# Patient Record
Sex: Male | Born: 1970
Health system: Southern US, Community
[De-identification: ages and names within clinical notes are randomized; demographics above are authoritative.]

## PROBLEM LIST (undated history)

## (undated) DIAGNOSIS — I214 Non-ST elevation (NSTEMI) myocardial infarction: Secondary | ICD-10-CM

## (undated) DIAGNOSIS — I639 Cerebral infarction, unspecified: Secondary | ICD-10-CM

## (undated) DIAGNOSIS — R972 Elevated prostate specific antigen [PSA]: Secondary | ICD-10-CM

## (undated) DIAGNOSIS — E7800 Pure hypercholesterolemia, unspecified: Secondary | ICD-10-CM

## (undated) DIAGNOSIS — I10 Essential (primary) hypertension: Secondary | ICD-10-CM

## (undated) DIAGNOSIS — C61 Malignant neoplasm of prostate: Secondary | ICD-10-CM

## (undated) DIAGNOSIS — Q2112 Patent foramen ovale: Secondary | ICD-10-CM

## (undated) HISTORY — DX: Patent foramen ovale: Q21.12

## (undated) HISTORY — DX: Non-ST elevation (NSTEMI) myocardial infarction: I21.4

## (undated) HISTORY — DX: Pure hypercholesterolemia, unspecified: E78.00

## (undated) HISTORY — DX: Cerebral infarction, unspecified: I63.9

## (undated) HISTORY — PX: PROSTATE BIOPSY: SHX241

---

## 2010-01-11 ENCOUNTER — Encounter
Admission: RE | Admit: 2010-01-11 | Discharge: 2010-01-11 | Payer: Self-pay | Source: Home / Self Care | Attending: Family Medicine | Admitting: Family Medicine

## 2010-11-23 ENCOUNTER — Emergency Department (HOSPITAL_COMMUNITY)
Admission: EM | Admit: 2010-11-23 | Discharge: 2010-11-23 | Disposition: A | Discharge status: Home / Self Care | Payer: 59 | Attending: Emergency Medicine | Admitting: Emergency Medicine

## 2010-11-23 DIAGNOSIS — R040 Epistaxis: Secondary | ICD-10-CM | POA: Insufficient documentation

## 2010-11-23 DIAGNOSIS — I10 Essential (primary) hypertension: Secondary | ICD-10-CM | POA: Insufficient documentation

## 2010-11-23 DIAGNOSIS — Z79899 Other long term (current) drug therapy: Secondary | ICD-10-CM | POA: Insufficient documentation

## 2010-11-23 HISTORY — DX: Essential (primary) hypertension: I10

## 2010-11-23 MED ORDER — OXYMETAZOLINE HCL 0.05 % NA SOLN
1.0000 | Freq: Two times a day (BID) | NASAL | Status: DC
  Filled 2010-11-23: qty 15

## 2010-11-23 MED ORDER — OXYMETAZOLINE HCL 0.05 % NA SOLN
2.0000 | Freq: Once | NASAL | Status: AC
  Administered 2010-11-23: 2 via NASAL
  Filled 2010-11-23: qty 15

## 2010-11-23 NOTE — ED Provider Notes (Signed)
History     CSN: [id number] Arrival date & time: 11/23/2010  2:06 PM   First MD Initiated Contact with Patient 11/23/10 1529      No chief complaint on file.   (Consider location/radiation/quality/duration/timing/severity/associated sxs/prior treatment) HPI Aside from a cold, was in his [facility] prior to yesterday when he had a few episodes of epistaxis, which all ceased spontaneously.  Today, about four hours pta, the patient developed epistaxis again.  Over the interval four hours he has had several similar episodes. No ha, lh, n/v, near-syncope, dyspnea or other complaints.  Past Medical History  Diagnosis Date  . Hypertension     History reviewed. No pertinent past surgical history.  History reviewed. No pertinent family history.  History  Substance Use Topics  . Smoking status: Never Smoker   . Smokeless tobacco: Not on file  . Alcohol Use: No      Review of Systems  All other systems reviewed and are negative.    Allergies  Review of patient's allergies indicates no known allergies.  Home Medications   Current Outpatient Rx  Name Route Sig Dispense Refill  . NEBIVOLOL HCL 20 MG PO TABS Oral Take 1 tablet by mouth daily.      Marland Kitchen ROSUVASTATIN CALCIUM 10 MG PO TABS Oral Take 10 mg by mouth daily.        BP 179/99  Pulse 79  Temp(Src) 98.1 F (36.7 C) (Oral)  Resp 20  SpO2 98%  Physical Exam  Constitutional: He is oriented to person, place, and time. He appears well-developed and well-nourished.  HENT:  Head: Normocephalic and atraumatic.       R nare w erythematous medial surface, no clear bleeding.  Eyes: Conjunctivae are normal. Pupils are equal, round, and reactive to light.  Neck: Neck supple.  Cardiovascular: Normal rate and regular rhythm.   Pulmonary/Chest: No respiratory distress.  Abdominal: Soft. There is no tenderness.  Musculoskeletal: He exhibits no edema.  Neurological: He is alert and oriented to person, place, and time.  Skin: Skin is  warm and dry.  Psychiatric: He has a normal mood and affect.    ED Course  EPISTAXIS MANAGEMENT Date/Time: 11/23/2010 3:00 PM Performed by: Gerhard Munch Authorized by: Gerhard Munch Consent: Verbal consent obtained. The procedure was performed in an emergent situation. Risks and benefits: risks, benefits and alternatives were discussed Consent given by: patient Patient understanding: patient states understanding of the procedure being performed Patient consent: the patient's understanding of the procedure matches consent given Procedure consent: procedure consent matches procedure scheduled Patient identity confirmed: verbally with patient Time out: Immediately prior to procedure a "time out" was called to verify the correct patient, procedure, equipment, support staff and site/side marked as required. Local anesthetic: pseudophedrine. Patient sedated: no Treatment site: right Kiesselbach's area Repair method: cophenylcaine Post-procedure assessment: bleeding stopped Treatment complexity: simple Patient tolerance: Patient tolerated the procedure well with no immediate complications.   (including critical care time)  Labs Reviewed - No data to display No results found.   1. Epistaxis       MDM  This previously well male presented w concerns of ongoing epistaxis.  Following cleaning and packing (temp) w phenylephrine soaked gauze, the bleeding ceased.  On re-eval, the patient had no active bleeding and was d/c w explicit return precautions.        Gerhard Munch, MD 11/24/10 [phone]

## 2010-11-23 NOTE — ED Notes (Signed)
sts nose bleed at 10 am while watching tv, now controlled. sts a clot came when he blew his nose and bleeding worse when occurred.

## 2010-11-23 NOTE — ED Notes (Signed)
Pt. Beginning to have bleeding from Rt. Nares again. ED MD aware and pt. Brought to room 10 for further examination. ENT cart to room as well

## 2015-03-18 ENCOUNTER — Encounter (HOSPITAL_COMMUNITY): Payer: Self-pay | Admitting: Emergency Medicine

## 2015-03-18 ENCOUNTER — Inpatient Hospital Stay (HOSPITAL_COMMUNITY): Payer: 59

## 2015-03-18 ENCOUNTER — Observation Stay (HOSPITAL_COMMUNITY)
Admission: EM | Admit: 2015-03-18 | Discharge: 2015-03-20 | Disposition: A | Discharge status: Home / Self Care | Payer: 59 | Attending: Internal Medicine | Admitting: Internal Medicine

## 2015-03-18 DIAGNOSIS — R748 Abnormal levels of other serum enzymes: Secondary | ICD-10-CM | POA: Diagnosis not present

## 2015-03-18 DIAGNOSIS — E782 Mixed hyperlipidemia: Secondary | ICD-10-CM | POA: Diagnosis not present

## 2015-03-18 DIAGNOSIS — N183 Chronic kidney disease, stage 3 unspecified: Secondary | ICD-10-CM | POA: Diagnosis present

## 2015-03-18 DIAGNOSIS — I10 Essential (primary) hypertension: Secondary | ICD-10-CM | POA: Diagnosis present

## 2015-03-18 DIAGNOSIS — R778 Other specified abnormalities of plasma proteins: Secondary | ICD-10-CM | POA: Diagnosis present

## 2015-03-18 DIAGNOSIS — Z79899 Other long term (current) drug therapy: Secondary | ICD-10-CM | POA: Diagnosis not present

## 2015-03-18 DIAGNOSIS — N179 Acute kidney failure, unspecified: Secondary | ICD-10-CM | POA: Insufficient documentation

## 2015-03-18 DIAGNOSIS — E876 Hypokalemia: Secondary | ICD-10-CM | POA: Diagnosis present

## 2015-03-18 DIAGNOSIS — I130 Hypertensive heart and chronic kidney disease with heart failure and stage 1 through stage 4 chronic kidney disease, or unspecified chronic kidney disease: Secondary | ICD-10-CM | POA: Insufficient documentation

## 2015-03-18 DIAGNOSIS — I5033 Acute on chronic diastolic (congestive) heart failure: Secondary | ICD-10-CM | POA: Diagnosis not present

## 2015-03-18 DIAGNOSIS — R739 Hyperglycemia, unspecified: Secondary | ICD-10-CM | POA: Diagnosis not present

## 2015-03-18 DIAGNOSIS — Z6841 Body Mass Index (BMI) 40.0 and over, adult: Secondary | ICD-10-CM | POA: Diagnosis not present

## 2015-03-18 DIAGNOSIS — I161 Hypertensive emergency: Principal | ICD-10-CM | POA: Diagnosis present

## 2015-03-18 DIAGNOSIS — Z9114 Patient's other noncompliance with medication regimen: Secondary | ICD-10-CM | POA: Diagnosis not present

## 2015-03-18 DIAGNOSIS — R7989 Other specified abnormal findings of blood chemistry: Secondary | ICD-10-CM | POA: Diagnosis present

## 2015-03-18 DIAGNOSIS — R0989 Other specified symptoms and signs involving the circulatory and respiratory systems: Secondary | ICD-10-CM

## 2015-03-18 LAB — TROPONIN I
TROPONIN I: 0.12 ng/mL — AB (ref <0.031)
TROPONIN I: 0.13 ng/mL — AB (ref <0.031)

## 2015-03-18 LAB — BASIC METABOLIC PANEL
ANION GAP: 14 (ref 5–15)
BUN: 22 mg/dL — ABNORMAL HIGH (ref 6–20)
CO2: 25 mmol/L (ref 22–32)
Calcium: 9.3 mg/dL (ref 8.9–10.3)
Chloride: 103 mmol/L (ref 101–111)
Creatinine, Ser: 1.51 mg/dL — ABNORMAL HIGH (ref 0.61–1.24)
GFR, EST NON AFRICAN AMERICAN: 55 mL/min — AB (ref >60)
GLUCOSE: 139 mg/dL — AB (ref 65–99)
POTASSIUM: 3.4 mmol/L — AB (ref 3.5–5.1)
Sodium: 142 mmol/L (ref 135–145)

## 2015-03-18 LAB — CBC
HEMATOCRIT: 48.3 % (ref 39.0–52.0)
HEMOGLOBIN: 15.6 g/dL (ref 13.0–17.0)
MCH: 25.6 pg — ABNORMAL LOW (ref 26.0–34.0)
MCHC: 32.3 g/dL (ref 30.0–36.0)
MCV: 79.2 fL (ref 78.0–100.0)
Platelets: 256 10*3/uL (ref 150–400)
RBC: 6.1 MIL/uL — AB (ref 4.22–5.81)
RDW: 14.5 % (ref 11.5–15.5)
WBC: 6.8 10*3/uL (ref 4.0–10.5)

## 2015-03-18 LAB — I-STAT TROPONIN, ED: Troponin i, poc: 0.13 ng/mL (ref 0.00–0.08)

## 2015-03-18 LAB — BRAIN NATRIURETIC PEPTIDE: B NATRIURETIC PEPTIDE 5: 212.1 pg/mL — AB (ref 0.0–100.0)

## 2015-03-18 MED ORDER — METOPROLOL TARTRATE 50 MG PO TABS
50.0000 mg | ORAL_TABLET | Freq: Two times a day (BID) | ORAL | Status: DC
  Administered 2015-03-18 – 2015-03-19 (×3): 50 mg via ORAL
  Filled 2015-03-18: qty 2
  Filled 2015-03-18 (×2): qty 1
  Filled 2015-03-18 (×2): qty 2

## 2015-03-18 MED ORDER — ASPIRIN 81 MG PO CHEW
81.0000 mg | CHEWABLE_TABLET | Freq: Every day | ORAL | Status: DC
  Administered 2015-03-18: 81 mg via ORAL
  Filled 2015-03-18 (×2): qty 1

## 2015-03-18 MED ORDER — SODIUM CHLORIDE 0.9% FLUSH: 3.0000 mL | INTRAVENOUS | Status: DC | PRN

## 2015-03-18 MED ORDER — SODIUM CHLORIDE 0.9 % IV SOLN: 250.0000 mL | INTRAVENOUS | Status: DC | PRN

## 2015-03-18 MED ORDER — SODIUM CHLORIDE 0.9% FLUSH
3.0000 mL | Freq: Two times a day (BID) | INTRAVENOUS | Status: DC
  Administered 2015-03-18: 3 mL via INTRAVENOUS

## 2015-03-18 MED ORDER — SODIUM CHLORIDE 0.9% FLUSH
3.0000 mL | Freq: Two times a day (BID) | INTRAVENOUS | Status: DC
  Administered 2015-03-20: 3 mL via INTRAVENOUS

## 2015-03-18 MED ORDER — ACETAMINOPHEN 650 MG RE SUPP: 650.0000 mg | Freq: Four times a day (QID) | RECTAL | Status: DC | PRN

## 2015-03-18 MED ORDER — LABETALOL HCL 5 MG/ML IV SOLN
10.0000 mg | Freq: Once | INTRAVENOUS | Status: AC
  Administered 2015-03-18: 10 mg via INTRAVENOUS
  Filled 2015-03-18: qty 4

## 2015-03-18 MED ORDER — ACETAMINOPHEN 325 MG PO TABS: 650.0000 mg | ORAL_TABLET | Freq: Four times a day (QID) | ORAL | Status: DC | PRN

## 2015-03-18 MED ORDER — CHLORTHALIDONE 25 MG PO TABS
25.0000 mg | ORAL_TABLET | Freq: Every day | ORAL | Status: DC
  Administered 2015-03-18 – 2015-03-20 (×3): 25 mg via ORAL
  Filled 2015-03-18 (×4): qty 1

## 2015-03-18 MED ORDER — HYDRALAZINE HCL 20 MG/ML IJ SOLN: 5.0000 mg | Freq: Four times a day (QID) | INTRAMUSCULAR | Status: DC | PRN

## 2015-03-18 MED ORDER — POTASSIUM CHLORIDE CRYS ER 20 MEQ PO TBCR
20.0000 meq | EXTENDED_RELEASE_TABLET | Freq: Once | ORAL | Status: AC
  Administered 2015-03-18: 20 meq via ORAL
  Filled 2015-03-18: qty 1

## 2015-03-18 MED ORDER — AMLODIPINE BESYLATE 5 MG PO TABS
5.0000 mg | ORAL_TABLET | Freq: Every day | ORAL | Status: DC
  Administered 2015-03-18: 5 mg via ORAL
  Filled 2015-03-18: qty 1

## 2015-03-18 MED ORDER — ASPIRIN EC 81 MG PO TBEC
81.0000 mg | DELAYED_RELEASE_TABLET | Freq: Every day | ORAL | Status: DC
  Administered 2015-03-19 – 2015-03-20 (×2): 81 mg via ORAL
  Filled 2015-03-18 (×2): qty 1

## 2015-03-18 NOTE — ED Notes (Signed)
Accepting nurse to call back for report 

## 2015-03-18 NOTE — ED Notes (Signed)
Pt was seen today t PCP was was told BP was elevated. Pt was given something there and told to come to ED. Pt has hx of HTN but off of meds for over 1 year. Pt denies any complaints or pain at this time.

## 2015-03-18 NOTE — ED Provider Notes (Signed)
CSN: [id number]     Arrival date & time 03/18/15  1531 History   First MD Initiated Contact with Patient 03/18/15 1643     Chief Complaint  Patient presents with  . Hypertension     (Consider location/radiation/quality/duration/timing/severity/associated sxs/prior Treatment) HPI  45 year old male presents with significantly elevated blood pressure. Patient and wife state patient has had blood pressure problems for quite some time but has been off of medicines for several years. Last saw a PCP 3 years ago. A week or 2 ago the patient had transient left arm numbness that felt like his arm was totally asleep. It was not weak at the time. Lasted about one hour. Has also had random sweating episodes and headaches. Due to this they establish a PCP or seen there today. No chest pain or chest pressure. Last time he had chest pain was over 6 months ago. Patient states he is short of breath after walking about 1 mile which is new for him and has been ongoing for several weeks to months. At rest she is not short of breath. No back pain. No current weakness or numbness. He went to [facility] physicians today he was given Tribenzor and sent to the ER for admission and workup.  Past Medical History  Diagnosis Date  . Hypertension    History reviewed. No pertinent past surgical history. No family history on file. Social History  Substance Use Topics  . Smoking status: Never Smoker   . Smokeless tobacco: None  . Alcohol Use: No    Review of Systems  Constitutional: Positive for diaphoresis. Negative for fever.  Respiratory: Positive for shortness of breath (after 1 mile of walking).   Cardiovascular: Negative for chest pain.  Gastrointestinal: Negative for vomiting and abdominal pain.  Musculoskeletal: Negative for back pain.  Neurological: Positive for headaches. Negative for dizziness.  All other systems reviewed and are negative.     Allergies  Review of patient's allergies indicates no known  allergies.  Home Medications   Prior to Admission medications   Medication Sig Start Date End Date Taking? Authorizing Provider  Nebivolol HCl (BYSTOLIC) 20 MG TABS Take 1 tablet by mouth daily.      Historical Provider, MD  rosuvastatin (CRESTOR) 10 MG tablet Take 10 mg by mouth daily.      Historical Provider, MD   BP 249/140 mmHg  Pulse 97  Temp(Src) 97.8 F (36.6 C) (Oral)  Resp 16  Ht 6' (1.829 m)  Wt 296 lb (134.265 kg)  BMI 40.14 kg/m2  SpO2 95% Physical Exam  Constitutional: He is oriented to person, place, and time. He appears well-developed and well-nourished.  HENT:  Head: Normocephalic and atraumatic.  Right Ear: External ear normal.  Left Ear: External ear normal.  Nose: Nose normal.  Eyes: Right eye exhibits no discharge. Left eye exhibits no discharge.  Neck: Neck supple.  Cardiovascular: Normal rate, regular rhythm, normal heart sounds and intact distal pulses.   Pulmonary/Chest: Effort normal and breath sounds normal.  Abdominal: Soft. He exhibits no distension. There is no tenderness.  Musculoskeletal: He exhibits no edema.  Neurological: He is alert and oriented to person, place, and time.  Skin: Skin is warm and dry. He is not diaphoretic.  Nursing note and vitals reviewed.   ED Course  Procedures (including critical care time) Labs Review Labs Reviewed  BASIC METABOLIC PANEL - Abnormal; Notable for the following:    Potassium 3.4 (*)    Glucose, Bld 139 (*)  BUN 22 (*)    Creatinine, Ser 1.51 (*)    GFR calc non Af Amer 55 (*)    All other components within normal limits  CBC - Abnormal; Notable for the following:    RBC 6.10 (*)    MCH 25.6 (*)    All other components within normal limits  BRAIN NATRIURETIC PEPTIDE - Abnormal; Notable for the following:    B Natriuretic Peptide 212.1 (*)    All other components within normal limits  TROPONIN I - Abnormal; Notable for the following:    Troponin I 0.12 (*)    All other components within  normal limits  I-STAT TROPOININ, ED - Abnormal; Notable for the following:    Troponin i, poc 0.13 (*)    All other components within normal limits  ALDOSTERONE + RENIN ACTIVITY W/ RATIO  TROPONIN I  TROPONIN I  TROPONIN I  LIPID PANEL    Imaging Review Dg Chest 2 View  03/18/2015  CLINICAL DATA:  UNTREATED HTN. NO DIAB. NON SMOKER. NO HX MI OR STROKE. NO CP OR SOB. NO COUGH OR CONGESTION. NO FEVER. NO N/V. LAST WEEK FELT RIGHT ARM TINGLING. EXAM: CHEST  2 VIEW COMPARISON:  01/11/2010 FINDINGS: Cardiac silhouette is mildly enlarged. Aorta is uncoiled. No mediastinal or hilar masses or evidence of adenopathy. Clear lungs.  No pleural effusion or pneumothorax. Skeletal structures are unremarkable. IMPRESSION: 1. No acute cardiopulmonary disease. 2. Mild stable cardiomegaly. Electronically Signed   By: Amie Portland M.D.   On: 03/18/2015 18:05   I have personally reviewed and evaluated these images and lab results as part of my medical decision-making.   EKG Interpretation   Date/Time:  Thursday March 18 2015 15:36:07 EST Ventricular Rate:  101 PR Interval:  208 QRS Duration: 102 QT Interval:  356 QTC Calculation: 461 R Axis:   13 Text Interpretation:  Sinus tachycardia Left ventricular hypertrophy with  repolarization abnormality Abnormal ECG No old tracing to compare  Confirmed by Audryna Wendt  MD, Keishawn Darsey ([id number]) on 03/18/2015 4:28:36 PM      CRITICAL CARE Performed by: Pricilla Loveless T   Total critical care time: 30 minutes  Critical care time was exclusive of separately billable procedures and treating other patients.  Critical care was necessary to treat or prevent imminent or life-threatening deterioration.  Critical care was time spent personally by me on the following activities: development of treatment plan with patient and/or surrogate as well as nursing, discussions with consultants, evaluation of patient's response to treatment, examination of patient, obtaining history  from patient or surrogate, ordering and performing treatments and interventions, ordering and review of laboratory studies, ordering and review of radiographic studies, pulse oximetry and re-evaluation of patient's condition.  MDM   Final diagnoses:  Hypertensive emergency  Elevated troponin    Patient presents with severe hypertension with elevated creatinine and elevated troponin. Patient has EKG changes consistent with LVH. He denies any acute ischemic changes such as shortness of breath, chest pain, or chest pressure. Likely this is from chronic heart strain from severe hypertension. Initial BP 249/140. He was given labetalol with some good response. He is completely asymptomatic but given signs of endorgan damage she will be admitted to the hospital in the step down unit. Cardiology, Dr. Jacinto Halim was consulted as well.    Pricilla Loveless, MD 03/18/15 2014

## 2015-03-18 NOTE — Consult Note (Addendum)
CARDIOLOGY CONSULT NOTE  Patient ID: Anthony Mcdaniel MRN: [id number] DOB/AGE: [birth year] 45 y.o.  Admit date: 03/18/2015 Referring Physician  [facility] Primary Physician:  No primary care provider on file. Reason for Consultation  Abnormal EKG  HPI: Anthony Mcdaniel  is a 44 y.o. male  With Patient was initially evaluated by me 2011 when he presented with abnormal EKG and hypertensive heart disease. An echocardiogram on 01/20/2010 that revealed a marked LVH, mild LV dilatation and moderate biatrial enlargement. He was started on aggressive medical therapy, however she discontinued all his medications since 2012 and has not had any further follow-up. Over the past 1 week he has noticed mild gradual worsening dyspnea, occasional headaches and also left arm tingling and numbness. Symptoms of left arm tingling completely resolved however his wife had already set up an appointment to see his PCP, was evaluated today and was sent to the emergency room due to markedly elevated blood pressure. Patient denies any chest pain, palpitation, dizziness or syncope. He states that he otherwise feels well and denies any recent changes in weight, no leg edema, no PND or orthopnea. He has had a sleep evaluation in 2012 and was told to be negative for sleep apnea.  Past Medical History  Diagnosis Date  . Hypertension      History reviewed. No pertinent past surgical history.   Family History: Father with ESRD on HD and eventually had renal transplant at age 43 years. Mother with stroke at age 81 or so.  Social History: Social History   Social History  . Marital Status: Married    Spouse Name: N/A  . Number of Children: N/A  . Years of Education: N/A   Occupational History  . Not on file.   Social History Main Topics  . Smoking status: Never Smoker   . Smokeless tobacco: Not on file  . Alcohol Use: No  . Drug Use: No  . Sexual Activity: Not on file   Other Topics Concern  . Not on file   Social History  Narrative   ROS: General: no fevers/chills/night sweats Eyes: no blurry vision, diplopia, or amaurosis ENT: no sore throat or hearing loss Resp: c/o Dyspnea on exertion. No cough, wheezing, or hemoptysis CV: no edema or palpitations GI: no abdominal pain, nausea, vomiting, diarrhea, or constipation GU: no dysuria, frequency, or hematuria Skin: no rash Neuro: no headache, numbness, tingling, or weakness of extremities Musculoskeletal: no joint pain or swelling Heme: no bleeding, DVT, or easy bruising Endo: no polydipsia or polyuria    Physical Exam: Blood pressure 202/135, pulse 79, temperature 97.8 F (36.6 C), temperature source Oral, resp. rate 22, height 6' (1.829 m), weight 134.265 kg (296 lb), SpO2 95 %. Body mass index is 40.14 kg/(m^2).   General appearance: alert, cooperative, appears stated age, no distress and morbidly obese Neck: no adenopathy, no JVD, supple, symmetrical, trachea midline, thyroid not enlarged, symmetric, no tenderness/mass/nodules and Soft right carotid bruit present Lungs: clear to auscultation bilaterally Chest wall: no tenderness Heart: regular rate and rhythm, S1, S2 normal, no murmur, click, rub or gallop Abdomen: soft, non-tender; bowel sounds normal; no masses,  no organomegaly Extremities: extremities normal, atraumatic, no cyanosis or edema Pulses: 2+ and symmetric Neurologic: Grossly normal  Labs:   Lab Results  Component Value Date   WBC 6.8 03/18/2015   HGB 15.6 03/18/2015   HCT 48.3 03/18/2015   MCV 79.2 03/18/2015   PLT 256 03/18/2015    Recent Labs Lab 03/18/15 1551  NA 142  K 3.4*  CL 103  CO2 25  BUN 22*  CREATININE 1.51*  CALCIUM 9.3  GLUCOSE 139*   EKG: 03/18/2015: Normal sinus rhythm, marked LVH with repolarization of clonality, cannot exclude inferior and lateral ischemia. Compared to my notes on 02/07/2009, no significant change.  Echo (office): 01/20/2010: Marked LVH, mild LV cavity dilatation, moderate biatrial  enlargement.   Radiology: Dg Chest 2 View  03/18/2015  CLINICAL DATA:  UNTREATED HTN. NO DIAB. NON SMOKER. NO HX MI OR STROKE. NO CP OR SOB. NO COUGH OR CONGESTION. NO FEVER. NO N/V. LAST WEEK FELT RIGHT ARM TINGLING. EXAM: CHEST  2 VIEW COMPARISON:  01/11/2010 FINDINGS: Cardiac silhouette is mildly enlarged. Aorta is uncoiled. No mediastinal or hilar masses or evidence of adenopathy. Clear lungs.  No pleural effusion or pneumothorax. Skeletal structures are unremarkable. IMPRESSION: 1. No acute cardiopulmonary disease. 2. Mild stable cardiomegaly. Electronically Signed   By: Amie Portland M.D.   On: 03/18/2015 18:05    Scheduled Meds: Continuous Infusions: PRN Meds:.  ASSESSMENT AND PLAN:  1. Hypertensive urgency with headache and left arm tingling and numbness, abnormal troponin probably related to hypertension with subendocardial ischemia. 2. Hypertension with hypertensive heart and chronic kidney disease stage I-II 3. Morbid obesity 4. Abnormal EKG 5. Shortness of breath and dyspnea on exertion secondary to hypertensive heart disease, acute on chronic diastolic heart failure.  Recommendation: I had a long discussion with the patient regarding compliance. I had actually dismissed the patient as he had not kept up his appointments. He had discontinued all his medications in 2012. I had a long discussion regarding his strong family history of renal failure in his father at age of 6 and needed transplantation do to end-stage lung disease and was on dialysis. Mother also had a stroke in her early 60s.  Please see medication orders, he will need multiple antihypertensive medications. Will repeat echocardiogram to evaluate his LV systolic function.  Will obtain carotid duplex due to right carotid bruit and left arm tingling.  Yates Decamp, MD 03/18/2015, 6:17 PM [facility] Pager: [phone] Office: [phone] If no answer Cell [phone]

## 2015-03-18 NOTE — ED Notes (Signed)
Inetta Fermo, RN accepts report at this time.

## 2015-03-18 NOTE — ED Notes (Signed)
Pt given TRIBENZOR at [facility] physicians before coming.

## 2015-03-18 NOTE — H&P (Signed)
[facility] History and Physical  Brewster Wolters ZOX:[id number] DOB: 09/12/1970 DOA: 03/18/2015  Referring physician: Lawrence Marseilles PCP: Emeterio Reeve, MD   Chief Complaint: DOE, elevated BP  HPI: Anthony Mcdaniel is a 45 y.o. male  With history of hypertension who had ran out of medicines and was evaluated at the primary care physician office today. During evaluation patient was found to have elevated blood pressures and was given an antihypertensive which included 3 antihypertensives and 1 pill and was referred to the emergency department. Patient has no new complaints currently. He states that in the past he has had headaches and has even had left arm numbness. The patient also endorses dyspnea on exertion. Currently as mentioned above he has no new complaints he is unsure of what makes his blood pressure worse or better. The problem since onset has been persistent. The patient states that he has had problems with his blood pressures for several days and maybe even months.   Review of Systems:  Constitutional:  No weight loss, night sweats, Fevers, chills, fatigue.  HEENT:  No headaches, Difficulty swallowing,Tooth/dental problems,Sore throat,  No sneezing, itching, ear ache, nasal congestion, post nasal drip,  Cardio-vascular:  No chest pain, Orthopnea, PND, + swelling in lower extremities, anasarca, dizziness, palpitations  GI:  No heartburn, indigestion, abdominal pain, nausea, vomiting, diarrhea, change in bowel habits, loss of appetite  Resp:  No shortness of breath with exertion or at rest. No excess mucus, no productive cough, No non-productive cough, No coughing up of blood.No change in color of mucus.No wheezing.No chest wall deformity  Skin:  no rash or lesions.  GU:  no dysuria, change in color of urine, no urgency or frequency. No flank pain.  Musculoskeletal:  No joint pain or swelling. No decreased range of motion. No back pain.  Psych:  No change in mood or affect.  No depression or anxiety. No memory loss.   Past Medical History  Diagnosis Date  . Hypertension    History reviewed. No pertinent past surgical history. Social History:  reports that he has never smoked. He does not have any smokeless tobacco history on file. He reports that he does not drink alcohol or use illicit drugs.  No Known Allergies  Family history - HTN - STrokes  Prior to Admission medications   Medication Sig Start Date End Date Taking? Authorizing Provider  aspirin EC 81 MG tablet Take 81 mg by mouth daily.   Yes Historical Provider, MD   Physical Exam: Filed Vitals:   03/18/15 1552 03/18/15 1718 03/18/15 1730 03/18/15 1745  BP: 249/140 198/136 206/129 202/135  Pulse:  93 80 79  Temp:      TempSrc:      Resp:  Height:      Weight:      SpO2:  97% 89% 95%    Wt Readings from Last 3 Encounters:  03/18/15 134.265 kg (296 lb)    General:  Appears calm and comfortable Eyes: PERRL, normal lids, irises & conjunctiva ENT: grossly normal hearing, lips & tongue Neck: no LAD, masses or thyromegaly Cardiovascular: RRR, no m/r/g. + LE edema. Respiratory: CTA bilaterally, no w/r/r. Normal respiratory effort. Abdomen: soft, nt, nd Skin: no rash or induration seen on limited exam Musculoskeletal: grossly normal tone BUE/BLE Psychiatric: grossly normal mood and affect, speech fluent and appropriate Neurologic: grossly non-focal.          Labs on Admission:  Basic Metabolic Panel:  Recent Labs Lab 03/18/15 1551  NA 142  K 3.4*  CL 103  CO2 25  GLUCOSE 139*  BUN 22*  CREATININE 1.51*  CALCIUM 9.3   Liver Function Tests: No results for input(s): AST, ALT, ALKPHOS, BILITOT, PROT, ALBUMIN in the last 168 hours. No results for input(s): LIPASE, AMYLASE in the last 168 hours. No results for input(s): AMMONIA in the last 168 hours. CBC:  Recent Labs Lab 03/18/15 1551  WBC 6.8  HGB 15.6  HCT 48.3  MCV 79.2  PLT 256   Cardiac  Enzymes:  Recent Labs Lab 03/18/15 1721  TROPONINI 0.12*    BNP (last 3 results)  Recent Labs  03/18/15 1717  BNP 212.1*    ProBNP (last 3 results) No results for input(s): PROBNP in the last 8760 hours.  CBG: No results for input(s): GLUCAP in the last 168 hours.  Radiological Exams on Admission: Dg Chest 2 View  03/18/2015  CLINICAL DATA:  UNTREATED HTN. NO DIAB. NON SMOKER. NO HX MI OR STROKE. NO CP OR SOB. NO COUGH OR CONGESTION. NO FEVER. NO N/V. LAST WEEK FELT RIGHT ARM TINGLING. EXAM: CHEST  2 VIEW COMPARISON:  01/11/2010 FINDINGS: Cardiac silhouette is mildly enlarged. Aorta is uncoiled. No mediastinal or hilar masses or evidence of adenopathy. Clear lungs.  No pleural effusion or pneumothorax. Skeletal structures are unremarkable. IMPRESSION: 1. No acute cardiopulmonary disease. 2. Mild stable cardiomegaly. Electronically Signed   By: Amie Portland M.D.   On: 03/18/2015 18:05    EKG: Independently reviewed. Sinus rhythm with inverted  T waves in inferior and lateral leads  Assessment/Plan Principal Problem:   Hypertensive emergency - Patient will need a 25% reduction in MAP from highest blood pressure recorded. Patient reports that his blood pressure systolic was as high as 260. Currently I do not have his highest blood pressure recording from the clinic but from my discussion with patient and family member patient was given an oral antihypertensive which included ARB, amlodipine, hydrochlorothiazide. As such on my evaluation his blood pressure has already been reduced. Of note he also received IV beta blocker and his MAP went from 176-156. Again since I do not know what his highest MAP was I will will maintain patent at a target MAP of 155 for the next 24 hours.  - Will place order for hydralazine when necessary if MAP > 160 - Will obtain EKG next a.m. and echocardiogram  Active Problems:   Hypokalemia - K dur 20 meq and reassess    AKI (acute kidney injury) (HCC) -  Secondary to principal problem listed above. With improvement in blood pressures would expect improvement in serum creatinine levels. We'll reassess next a.m.     Elevated troponin - Most likely related to principal problem listed above. - EKG reviewed. Discussed case with cardiologist and appreciate cardiology consult. Cardiology currently believes this is secondary to principal problem list above   Code Status: Full DVT Prophylaxis: SCDs Family Communication: Discussed with patient and family member Disposition Plan: Stepdown unit  Time spent: > 55 minutes  Penny Pia [facility] Pager [phone]

## 2015-03-19 ENCOUNTER — Ambulatory Visit (HOSPITAL_BASED_OUTPATIENT_CLINIC_OR_DEPARTMENT_OTHER): Payer: 59

## 2015-03-19 ENCOUNTER — Ambulatory Visit (HOSPITAL_COMMUNITY): Payer: 59

## 2015-03-19 DIAGNOSIS — N179 Acute kidney failure, unspecified: Secondary | ICD-10-CM

## 2015-03-19 DIAGNOSIS — R7989 Other specified abnormal findings of blood chemistry: Secondary | ICD-10-CM

## 2015-03-19 DIAGNOSIS — N183 Chronic kidney disease, stage 3 (moderate): Secondary | ICD-10-CM | POA: Diagnosis not present

## 2015-03-19 DIAGNOSIS — E876 Hypokalemia: Secondary | ICD-10-CM

## 2015-03-19 DIAGNOSIS — I130 Hypertensive heart and chronic kidney disease with heart failure and stage 1 through stage 4 chronic kidney disease, or unspecified chronic kidney disease: Secondary | ICD-10-CM | POA: Diagnosis not present

## 2015-03-19 DIAGNOSIS — I5033 Acute on chronic diastolic (congestive) heart failure: Secondary | ICD-10-CM | POA: Diagnosis not present

## 2015-03-19 DIAGNOSIS — R0989 Other specified symptoms and signs involving the circulatory and respiratory systems: Secondary | ICD-10-CM

## 2015-03-19 DIAGNOSIS — I161 Hypertensive emergency: Principal | ICD-10-CM

## 2015-03-19 LAB — LIPID PANEL
Cholesterol: 251 mg/dL — ABNORMAL HIGH (ref 0–200)
HDL: 35 mg/dL — AB (ref >40)
LDL CALC: 182 mg/dL — AB (ref 0–99)
TRIGLYCERIDES: 168 mg/dL — AB (ref <150)
Total CHOL/HDL Ratio: 7.2 RATIO
VLDL: 34 mg/dL (ref 0–40)

## 2015-03-19 LAB — CBC
HEMATOCRIT: 43.1 % (ref 39.0–52.0)
Hemoglobin: 13.8 g/dL (ref 13.0–17.0)
MCH: 25.5 pg — ABNORMAL LOW (ref 26.0–34.0)
MCHC: 32 g/dL (ref 30.0–36.0)
MCV: 79.5 fL (ref 78.0–100.0)
Platelets: 217 10*3/uL (ref 150–400)
RBC: 5.42 MIL/uL (ref 4.22–5.81)
RDW: 15 % (ref 11.5–15.5)
WBC: 5.9 10*3/uL (ref 4.0–10.5)

## 2015-03-19 LAB — BASIC METABOLIC PANEL
ANION GAP: 11 (ref 5–15)
BUN: 20 mg/dL (ref 6–20)
CALCIUM: 9 mg/dL (ref 8.9–10.3)
CO2: 26 mmol/L (ref 22–32)
Chloride: 103 mmol/L (ref 101–111)
Creatinine, Ser: 1.41 mg/dL — ABNORMAL HIGH (ref 0.61–1.24)
GFR calc Af Amer: >60 mL/min (ref >60)
GFR, EST NON AFRICAN AMERICAN: 59 mL/min — AB (ref >60)
GLUCOSE: 107 mg/dL — AB (ref 65–99)
POTASSIUM: 3.3 mmol/L — AB (ref 3.5–5.1)
SODIUM: 140 mmol/L (ref 135–145)

## 2015-03-19 LAB — TROPONIN I
TROPONIN I: 0.08 ng/mL — AB (ref <0.031)
TROPONIN I: 0.09 ng/mL — AB (ref <0.031)
TROPONIN I: 0.09 ng/mL — AB (ref <0.031)
Troponin I: 0.09 ng/mL — ABNORMAL HIGH (ref <0.031)

## 2015-03-19 LAB — TSH: TSH: 1.908 u[IU]/mL (ref 0.350–4.500)

## 2015-03-19 LAB — MRSA PCR SCREENING: MRSA by PCR: NEGATIVE

## 2015-03-19 MED ORDER — SPIRONOLACTONE 25 MG PO TABS
50.0000 mg | ORAL_TABLET | Freq: Every day | ORAL | Status: DC
  Administered 2015-03-19 – 2015-03-20 (×2): 50 mg via ORAL
  Filled 2015-03-19 (×2): qty 2

## 2015-03-19 MED ORDER — HYDRALAZINE HCL 20 MG/ML IJ SOLN
10.0000 mg | Freq: Four times a day (QID) | INTRAMUSCULAR | Status: DC | PRN
  Administered 2015-03-20 (×2): 10 mg via INTRAVENOUS
  Filled 2015-03-19 (×2): qty 1

## 2015-03-19 MED ORDER — FUROSEMIDE 20 MG PO TABS
20.0000 mg | ORAL_TABLET | Freq: Two times a day (BID) | ORAL | Status: DC
Start: 2015-03-19 — End: 2015-03-19

## 2015-03-19 MED ORDER — ISOSORBIDE MONONITRATE ER 30 MG PO TB24
15.0000 mg | ORAL_TABLET | Freq: Every day | ORAL | Status: DC
  Administered 2015-03-19: 15 mg via ORAL
  Filled 2015-03-19: qty 1

## 2015-03-19 MED ORDER — ATORVASTATIN CALCIUM 40 MG PO TABS
40.0000 mg | ORAL_TABLET | Freq: Every day | ORAL | Status: DC
  Administered 2015-03-19: 40 mg via ORAL
  Filled 2015-03-19: qty 1

## 2015-03-19 MED ORDER — POTASSIUM CHLORIDE CRYS ER 20 MEQ PO TBCR
40.0000 meq | EXTENDED_RELEASE_TABLET | Freq: Two times a day (BID) | ORAL | Status: AC
  Administered 2015-03-19 (×2): 40 meq via ORAL
  Filled 2015-03-19 (×2): qty 2

## 2015-03-19 MED ORDER — AMLODIPINE BESYLATE 10 MG PO TABS
10.0000 mg | ORAL_TABLET | Freq: Every day | ORAL | Status: DC
  Administered 2015-03-19 – 2015-03-20 (×2): 10 mg via ORAL
  Filled 2015-03-19 (×2): qty 1

## 2015-03-19 NOTE — Plan of Care (Signed)
Problem: Safety: Goal: Ability to remain free from injury will improve Outcome: Completed/Met Date Met:  03/19/15 Pt educated on safety measures put into place. Pt verbalized understanding.

## 2015-03-19 NOTE — Progress Notes (Signed)
Echocardiogram 2D Echocardiogram has been performed.  03/19/2015 10:46 AM Gertie FeyMichelle Vester Balthazor, RVT, RDCS, RDMS

## 2015-03-19 NOTE — Progress Notes (Signed)
[facility] PROGRESS NOTE    Progress Note   Layman Gully XLK:[id number] DOB: 1970-02-24 DOA: 03/18/2015 PCP: Emeterio Reeve, MD   Brief Narrative:   Anthony Mcdaniel is an 45 y.o. male with noncompliance of his medication, and for hypertensive emergency.  Assessment/Plan:   Hypertensive emergency: Blood pressure is improved slowly, his 2-D echo is pending. We'll DC IV when necessary 7 antihypertensive medication, he was started on a diuretic. Continue to titrate Norvasc. May need an ACE inhibitor, will await his renal function to stabilize. Add Imdur daily.  Hypokalemia -replete orally.  AKI (acute kidney injury) (HCC) No baseline to compare it with seems to be improving with control of his blood pressure.  Elevated troponin Not in the pattern of ACS, mildly prolonged QT T-wave inversions in most leads, with an LVH pattern which is likely the cause of his inverted T waves.    DVT Prophylaxis - Lovenox ordered.  Family Communication: none Disposition Plan: Home in am Code Status:     Code Status Orders        Start     Ordered   03/18/15 2323  Full code   Continuous     03/18/15 2322    Code Status History    Date Active Date Inactive Code Status Order ID Comments User Context   This patient has a current code status but no historical code status.        IV Access:    Peripheral IV   Procedures and diagnostic studies:   Dg Chest 2 View  03/18/2015  CLINICAL DATA:  UNTREATED HTN. NO DIAB. NON SMOKER. NO HX MI OR STROKE. NO CP OR SOB. NO COUGH OR CONGESTION. NO FEVER. NO N/V. LAST WEEK FELT RIGHT ARM TINGLING. EXAM: CHEST  2 VIEW COMPARISON:  01/11/2010 FINDINGS: Cardiac silhouette is mildly enlarged. Aorta is uncoiled. No mediastinal or hilar masses or evidence of adenopathy. Clear lungs.  No pleural effusion or pneumothorax. Skeletal structures are unremarkable. IMPRESSION: 1. No acute cardiopulmonary disease. 2. Mild stable cardiomegaly.  Electronically Signed   By: Amie Portland M.D.   On: 03/18/2015 18:05     Medical Consultants:    None.  Anti-Infectives:   Anti-infectives    None      Subjective:    Anthony Mcdaniel he is chest pain-free no shortness of breath.  Objective:    Filed Vitals:   03/19/15 0118 03/19/15 0318 03/19/15 0400 03/19/15 0518  BP: 169/109 171/111  178/118  Pulse: 74 73  81  Temp:   97.6 F (36.4 C)   TempSrc:   Oral   Resp: Height:      Weight:      SpO2: 94% 92%  92%    Intake/Output Summary (Last 24 hours) at 03/19/15 0750 Last data filed at 03/19/15 0500  Gross per 24 hour  Intake    240 ml  Output    650 ml  Net   -410 ml   Filed Weights   03/18/15 1550 03/18/15 2309  Weight: 134.265 kg (296 lb) 133.267 kg (293 lb 12.8 oz)    Exam: Gen:  NAD Cardiovascular:  RRR, No edema Chest and lungs:   CTAB Abdomen:  Abdomen soft, NT/ND, + BS Extremities:  No edema   Data Reviewed:    Labs: Basic Metabolic Panel:  Recent Labs Lab 03/18/15 1551  NA 142  K 3.4*  CL 103  CO2 25  GLUCOSE 139*  BUN 22*  CREATININE 1.51*  CALCIUM 9.3   GFR Estimated Creatinine Clearance: 87 mL/min (by C-G formula based on Cr of 1.51). Liver Function Tests: No results for input(s): AST, ALT, ALKPHOS, BILITOT, PROT, ALBUMIN in the last 168 hours. No results for input(s): LIPASE, AMYLASE in the last 168 hours. No results for input(s): AMMONIA in the last 168 hours. Coagulation profile No results for input(s): INR, PROTIME in the last 168 hours.  CBC:  Recent Labs Lab 03/18/15 1551 03/19/15 0621  WBC 6.8 5.9  HGB 15.6 13.8  HCT 48.3 43.1  MCV 79.2 79.5  PLT 256 217   Cardiac Enzymes:  Recent Labs Lab 03/18/15 1721 03/18/15 1856 03/19/15 0034  TROPONINI 0.12* 0.13* 0.09*   BNP (last 3 results) No results for input(s): PROBNP in the last 8760 hours. CBG: No results for input(s): GLUCAP in the last 168 hours. D-Dimer: No results for input(s):  DDIMER in the last 72 hours. Hgb A1c: No results for input(s): HGBA1C in the last 72 hours. Lipid Profile:  Recent Labs  03/19/15 0034  CHOL 251*  HDL 35*  LDLCALC 182*  TRIG 168*  CHOLHDL 7.2   Thyroid function studies:  Recent Labs  03/19/15 0035  TSH 1.908   Anemia work up: No results for input(s): VITAMINB12, FOLATE, FERRITIN, TIBC, IRON, RETICCTPCT in the last 72 hours. Sepsis Labs:  Recent Labs Lab 03/18/15 1551 03/19/15 0621  WBC 6.8 5.9   Microbiology Recent Results (from the past 240 hour(s))  MRSA PCR Screening     Status: None   Collection Time: 03/18/15 11:17 PM  Result Value Ref Range Status   MRSA by PCR NEGATIVE NEGATIVE Final    Comment:        The GeneXpert MRSA Assay (FDA approved for NASAL specimens only), is one component of a comprehensive MRSA colonization surveillance program. It is not intended to diagnose MRSA infection nor to guide or monitor treatment for MRSA infections.      Medications:   . amLODipine  5 mg Oral Daily  . aspirin  81 mg Oral Daily  . aspirin EC  81 mg Oral Daily  . chlorthalidone  25 mg Oral Daily  . metoprolol tartrate  50 mg Oral BID  . sodium chloride flush  3 mL Intravenous Q12H  . sodium chloride flush  3 mL Intravenous Q12H   Continuous Infusions:   Time spent: 25 min   LOS: 1 day   Anthony Mcdaniel, Anthony Mcdaniel  [facility] Pager [phone]  *Please refer to [email or web address], password [email or web address] to get updated schedule on who will round on this patient, as hospitalists switch teams weekly. If 7PM-7AM, please contact night-coverage at [email or web address], password [email or web address] for any overnight needs.  03/19/2015, 7:50 AM

## 2015-03-19 NOTE — Progress Notes (Signed)
Subjective:  Pt sitting on side of bed eating breakfast, denies any symptoms or concerns this morning. No chest pain or SOB, denies any further arm tingling.  Echo and carotid duplex pending.  Objective:  Vital Signs in the last 24 hours: Temp:  [97.6 F (36.4 C)-98.3 F (36.8 C)] 98.2 F (36.8 C) (03/03 0818) Pulse Rate:  [71-97] 83 (03/03 0818) Resp:  [11-23] 15 (03/03 0518) BP: (169-249)/(108-143) 191/108 mmHg (03/03 0818) SpO2:  [89 %-97 %] 94 % (03/03 0818) Weight:  [133.267 kg (293 lb 12.8 oz)-134.265 kg (296 lb)] 133.267 kg (293 lb 12.8 oz) (03/02 2309)  Intake/Output from previous day: 03/02 0701 - 03/03 0700 In: 240 [P.O.:240] Out: 650 [Urine:650]  Physical Exam: General appearance: alert, cooperative, appears stated age, no distress and morbidly obese Neck: no adenopathy, no JVD, supple, symmetrical, trachea midline, thyroid not enlarged, symmetric, no tenderness/mass/nodules and Soft right carotid bruit present Lungs: clear to auscultation bilaterally Chest wall: no tenderness Heart: regular rate and rhythm, S1, S2 normal, no murmur, click, rub or gallop Abdomen: soft, non-tender; bowel sounds normal; no masses, no organomegaly Extremities: extremities normal, atraumatic, no cyanosis or edema Pulses: 2+ and symmetric Neurologic: Grossly normal  Lab Results: BMP  Recent Labs  03/18/15 1551 03/19/15 0621  NA 142 140  K 3.4* 3.3*  CL 103 103  CO2 25 26  GLUCOSE 139* 107*  BUN 22* 20  CREATININE 1.51* 1.41*  CALCIUM 9.3 9.0  GFRNONAA 55* 59*  GFRAA >60 >60    CBC  Recent Labs Lab 03/19/15 0621  WBC 5.9  RBC 5.42  HGB 13.8  HCT 43.1  PLT 217  MCV 79.5  MCH 25.5*  MCHC 32.0  RDW 15.0    HEMOGLOBIN A1C No results found for: HGBA1C, MPG  Cardiac Panel (last 3 results)  Recent Labs  03/18/15 1856 03/19/15 0034 03/19/15 0621  TROPONINI 0.13* 0.09* 0.09*    BNP (last 3 results) No results for input(s): PROBNP in the last 8760  hours.  TSH  Recent Labs  03/19/15 0035  TSH 1.908    CHOLESTEROL  Recent Labs  03/19/15 0034  CHOL 251*    Hepatic Function Panel No results for input(s): PROT, ALBUMIN, AST, ALT, ALKPHOS, BILITOT, BILIDIR, IBILI in the last 8760 hours.  Imaging: Dg Chest 2 View  03/18/2015  CLINICAL DATA:  UNTREATED HTN. NO DIAB. NON SMOKER. NO HX MI OR STROKE. NO CP OR SOB. NO COUGH OR CONGESTION. NO FEVER. NO N/V. LAST WEEK FELT RIGHT ARM TINGLING. EXAM: CHEST  2 VIEW COMPARISON:  01/11/2010 FINDINGS: Cardiac silhouette is mildly enlarged. Aorta is uncoiled. No mediastinal or hilar masses or evidence of adenopathy. Clear lungs.  No pleural effusion or pneumothorax. Skeletal structures are unremarkable. IMPRESSION: 1. No acute cardiopulmonary disease. 2. Mild stable cardiomegaly. Electronically Signed   By: Amie Portland M.D.   On: 03/18/2015 18:05    Cardiac Studies: EKG: 03/19/2015: Normal sinus rhythm at a rate of 76 bpm, marked LVH with repolarization abnormality, cannot exclude inferior and anterolateral ischemia. No significant change.  Echo: pending  Carotid duplex: pending  Assessment/Plan:  1. Hypertensive urgency with headache and left arm tingling and numbness, abnormal troponin probably related to hypertension with subendocardial ischemia. 2. Hypertension with hypertensive heart and chronic kidney disease stage I-II 3. Morbid obesity 4. Abnormal EKG 5. Shortness of breath and dyspnea on exertion secondary to hypertensive heart disease, acute on chronic diastolic heart failure. 6. Hyperlipidemia, mixed  Recommendation: Blood pressure still markedly elevated,  although MAP improved somewhat from 150s to 125. Echo and carotid duplex pending. No further symptoms. Troponin remains flat. LDL markedly elevated at 182, add atorvastatin 40mg . Aldosterone:Renin Activity pending.    Erling ConteAllison,Derry Kassel Nicole, NP-C 03/19/2015, 8:09 AM [facility] Pager: [phone] Office:  [phone] If no answer: [phone]

## 2015-03-19 NOTE — Progress Notes (Signed)
UR Completed Makaylin Carlo Graves-Bigelow, RN,BSN [phone]  

## 2015-03-19 NOTE — Progress Notes (Signed)
Pt BP 183/117. MD notified. No new orders received. Will continue to monitor.    Reginold AgentWhitney Jaylenne Hamelin, RN

## 2015-03-19 NOTE — Progress Notes (Signed)
VASCULAR LAB PRELIMINARY  PRELIMINARY  PRELIMINARY  PRELIMINARY  Carotid duplex completed.    Preliminary report:  Bilateral:  1-39% ICA stenosis.  Vertebral artery flow is antegrade.      Lilybelle Mayeda, RVT 03/19/2015, 10:01 AM

## 2015-03-20 DIAGNOSIS — N183 Chronic kidney disease, stage 3 (moderate): Secondary | ICD-10-CM

## 2015-03-20 DIAGNOSIS — E876 Hypokalemia: Secondary | ICD-10-CM | POA: Diagnosis not present

## 2015-03-20 DIAGNOSIS — R7989 Other specified abnormal findings of blood chemistry: Secondary | ICD-10-CM | POA: Diagnosis not present

## 2015-03-20 DIAGNOSIS — I161 Hypertensive emergency: Secondary | ICD-10-CM | POA: Diagnosis not present

## 2015-03-20 LAB — BASIC METABOLIC PANEL
Anion gap: 13 (ref 5–15)
BUN: 20 mg/dL (ref 6–20)
CHLORIDE: 101 mmol/L (ref 101–111)
CO2: 26 mmol/L (ref 22–32)
Calcium: 9.2 mg/dL (ref 8.9–10.3)
Creatinine, Ser: 1.48 mg/dL — ABNORMAL HIGH (ref 0.61–1.24)
GFR calc Af Amer: >60 mL/min (ref >60)
GFR, EST NON AFRICAN AMERICAN: 56 mL/min — AB (ref >60)
GLUCOSE: 104 mg/dL — AB (ref 65–99)
POTASSIUM: 3.5 mmol/L (ref 3.5–5.1)
Sodium: 140 mmol/L (ref 135–145)

## 2015-03-20 LAB — TROPONIN I
Troponin I: 0.1 ng/mL — ABNORMAL HIGH (ref <0.031)
Troponin I: 0.07 ng/mL — ABNORMAL HIGH (ref <0.031)

## 2015-03-20 MED ORDER — CHLORTHALIDONE 25 MG PO TABS: 25.0000 mg | ORAL_TABLET | Freq: Every day | ORAL | Status: DC

## 2015-03-20 MED ORDER — METOPROLOL TARTRATE 100 MG PO TABS: 100.0000 mg | ORAL_TABLET | Freq: Two times a day (BID) | ORAL | Status: DC

## 2015-03-20 MED ORDER — ISOSORBIDE MONONITRATE ER 60 MG PO TB24
120.0000 mg | ORAL_TABLET | Freq: Every day | ORAL | Status: DC
  Administered 2015-03-20: 120 mg via ORAL
  Filled 2015-03-20: qty 2

## 2015-03-20 MED ORDER — METOPROLOL TARTRATE 100 MG PO TABS
100.0000 mg | ORAL_TABLET | Freq: Two times a day (BID) | ORAL | Status: DC
  Administered 2015-03-20: 100 mg via ORAL
  Filled 2015-03-20: qty 1

## 2015-03-20 MED ORDER — SPIRONOLACTONE 50 MG PO TABS: 50.0000 mg | ORAL_TABLET | Freq: Every day | ORAL | Status: DC

## 2015-03-20 MED ORDER — AMLODIPINE BESYLATE 10 MG PO TABS: 10.0000 mg | ORAL_TABLET | Freq: Every day | ORAL | Status: DC

## 2015-03-20 MED ORDER — ISOSORBIDE MONONITRATE ER 120 MG PO TB24: 120.0000 mg | ORAL_TABLET | Freq: Every day | ORAL | Status: DC

## 2015-03-20 MED ORDER — ATORVASTATIN CALCIUM 40 MG PO TABS: 40.0000 mg | ORAL_TABLET | Freq: Every day | ORAL | Status: DC

## 2015-03-20 NOTE — Progress Notes (Signed)
Subjective:  Pt sitting on side of bed eating breakfast, denies any symptoms or concerns this morning. No chest pain or SOB, denies any further arm tingling.  Tolerating the medications. Objective:  Vital Signs in the last 24 hours: Temp:  [97.6 F (36.4 C)-98.3 F (36.8 C)] 98.3 F (36.8 C) (03/04 0500) Pulse Rate:  [73-93] 79 (03/04 0500) Resp:  [15-22] 22 (03/04 0500) BP: (154-191)/(93-120) 188/117 mmHg (03/04 0700) SpO2:  [94 %-99 %] 97 % (03/04 0500) Weight:  [131.634 kg (290 lb 3.2 oz)] 131.634 kg (290 lb 3.2 oz) (03/04 0500)  Body mass index is 40.49 kg/(m^2).   Intake/Output from previous day: 03/03 0701 - 03/04 0700 In: 720 [P.O.:720] Out: 450 [Urine:450]  Physical Exam: General appearance: alert, cooperative, appears stated age, no distress and morbidly obese Neck: no adenopathy, no JVD, supple, symmetrical, trachea midline, thyroid not enlarged, symmetric, no tenderness/mass/nodules and Soft right carotid bruit present Lungs: clear to auscultation bilaterally Chest wall: no tenderness Heart: regular rate and rhythm, S1, S2 normal, no murmur, click, rub or gallop Abdomen: soft, non-tender; bowel sounds normal; no masses, no organomegaly Extremities: extremities normal, atraumatic, no cyanosis or edema Pulses: 2+ and symmetric Neurologic: Grossly normal  Lab Results: BMP  Recent Labs  03/18/15 1551 03/19/15 0621 03/20/15 0024  NA 142 140 140  K 3.4* 3.3* 3.5  CL 103 103 101  CO2 GLUCOSE 139* 107* 104*  BUN 22* 20 20  CREATININE 1.51* 1.41* 1.48*  CALCIUM 9.3 9.0 9.2  GFRNONAA 55* 59* 56*  GFRAA >60 >60 >60    CBC  Recent Labs Lab 03/19/15 0621  WBC 5.9  RBC 5.42  HGB 13.8  HCT 43.1  PLT 217  MCV 79.5  MCH 25.5*  MCHC 32.0  RDW 15.0    Cardiac Panel (last 3 results)  Recent Labs  03/19/15 1121 03/19/15 1840 03/20/15 0024  TROPONINI 0.09* 0.08* 0.10*   TSH  Recent Labs  03/19/15 0035  TSH 1.908   Imaging: Dg Chest  2 View  03/18/2015  CLINICAL DATA:  UNTREATED HTN. NO DIAB. NON SMOKER. NO HX MI OR STROKE. NO CP OR SOB. NO COUGH OR CONGESTION. NO FEVER. NO N/V. LAST WEEK FELT RIGHT ARM TINGLING. EXAM: CHEST  2 VIEW COMPARISON:  01/11/2010 FINDINGS: Cardiac silhouette is mildly enlarged. Aorta is uncoiled. No mediastinal or hilar masses or evidence of adenopathy. Clear lungs.  No pleural effusion or pneumothorax. Skeletal structures are unremarkable. IMPRESSION: 1. No acute cardiopulmonary disease. 2. Mild stable cardiomegaly. Electronically Signed   By: Amie Portland M.D.   On: 03/18/2015 18:05    Cardiac Studies: EKG: 03/19/2015: Normal sinus rhythm at a rate of 76 bpm, marked LVH with repolarization abnormality, cannot exclude inferior and anterolateral ischemia. No significant change.  Echo: pending  Carotid duplex: pending  Assessment/Plan:  1. Hypertensive urgency with headache and left arm tingling and numbness, abnormal troponin probably related to hypertension with subendocardial ischemia. 2. Hypertension with hypertensive heart and chronic kidney disease stage I-II 3. Morbid obesity 4. Abnormal EKG 5. Shortness of breath and dyspnea on exertion secondary to hypertensive heart disease, acute on chronic diastolic heart failure. 6. Hyperlipidemia, mixed 7. Hyperglycemia. Check A1c 8. Right carotid bruit. Carotid duplex prelim: No significant disease.  Recommendation: Blood pressure still markedly elevated, although MAP improved somewhat from 150s to 125. Echo findings consistent with hypertensive heart disease and preserved LVEF.  Increase metop to 100 BID and Imdur to 120 mg daily.  Aldosterone:Renin  Activity pending, but do not think this is secondary hypertension.  I suspect his blood pressure will remain elevated for a while, we can either continue to watch him for another 24 hours are can be discharged home today. I advised him not to return to work, I will see him back in the office in about one  week.   Yates DecampGANJI, Ashir Kunz, MD.  03/20/2015, 7:22 AM [facility] Pager: [phone] Office: [phone] If no answer: [phone]

## 2015-03-20 NOTE — Discharge Instructions (Signed)
Anthony CiscoKevin Mcdaniel was admitted to the Hospital on 03/18/2015 and Discharged on Discharge Date 03/20/2015 and should be excused from work/school   for 6   days starting 03/18/2015 , may return to work/school without any restrictions.  Call [name], [facility] [phone] with questions.  [name], [name] M.D on 03/20/2015,at 9:09 AM  [facility] Office  [phone]

## 2015-03-20 NOTE — Discharge Summary (Signed)
Physician Discharge Summary  Anthony Mcdaniel KZS:[id number] DOB: [birth year] DOA: 03/18/2015  PCP: Lilian Coma, MD  Admit date: 03/18/2015 Discharge date: 03/20/2015  Time spent: 35 minutes  Recommendations for Outpatient Follow-up:  1. Follow-up with cardiology in 1 week, will continue to titrate antihypertensive medications as needed. 2. We'll determine at that time and stressed as needed.   Discharge Diagnoses:  Principal Problem:   Hypertensive emergency Active Problems:   Hypokalemia   AKI (acute kidney injury) ([place])   Elevated troponin   Discharge Condition: stable  Diet recommendation: heart healthy  Filed Weights   03/18/15 1550 03/18/15 2309 03/20/15 0500  Weight: 134.265 kg (296 lb) 133.267 kg (293 lb 12.8 oz) 131.634 kg (290 lb 3.2 oz)    History of present illness:  45 year old gentleman has medical history of essential hypertension noncompliant with this medication at one to his PCPs office and was found to have an elevated blood pressure greater than 200 the patient endorsed left arm numbness and dyspnea on exertion.  Hospital Course:  Atypical chest pain due to hypertensive urgency/elevated troponin: His oral antihypertensive medications were started his blood pressure slowly improved. Cardiology was consulted and recommended to spironolactone. He showed a normal motion normal EF. Skin speckled pattern likely infiltrative disease. Increased thickness. His chest pain improved after blood pressure control, he had mild elevation in his troponin which has remained mildly elevated which is likely due to chronic renal disease. His anterior cruciate ligament was less than 40 and his LDL was greater than 150 was started on a statin.  Hyperkalemia: Repleted now resolved.  Chronic kidney disease stage III Remain at baseline. We'll have to repeat B met in 1 week, if creatinine remains stable would consider an ACE inhibitor.   Procedures:  2-D echo  Chest  x-ray  Consultations:  cardiology  Discharge Exam: Filed Vitals:   03/20/15 0821 03/20/15 0830  BP: 183/114   Pulse:  80  Temp:    Resp:      General: A&O x3 Cardiovascular: RRR Respiratory: good air movement CTA B/L  Discharge Instructions   Discharge Instructions    Diet - low sodium heart healthy    Complete by:  As directed      Increase activity slowly    Complete by:  As directed           Current Discharge Medication List    START taking these medications   Details  amLODipine (NORVASC) 10 MG tablet Take 1 tablet (10 mg total) by mouth daily. Qty: 30 tablet, Refills: 3    atorvastatin (LIPITOR) 40 MG tablet Take 1 tablet (40 mg total) by mouth daily at 6 PM. Qty: 30 tablet, Refills: 3    chlorthalidone (HYGROTON) 25 MG tablet Take 1 tablet (25 mg total) by mouth daily. Qty: 30 tablet, Refills: 3    isosorbide mononitrate (IMDUR) 120 MG 24 hr tablet Take 1 tablet (120 mg total) by mouth daily. Qty: 30 tablet, Refills: 3    metoprolol (LOPRESSOR) 100 MG tablet Take 1 tablet (100 mg total) by mouth 2 (two) times daily. Qty: 30 tablet, Refills: 3    spironolactone (ALDACTONE) 50 MG tablet Take 1 tablet (50 mg total) by mouth daily. Qty: 30 tablet, Refills: 3      CONTINUE these medications which have NOT CHANGED   Details  aspirin EC 81 MG tablet Take 81 mg by mouth daily.       No Known Allergies Follow-up Information    Follow up with  Adrian Prows, MD In 2 weeks.   Specialty:  Cardiology   Why:  hospital follow up   Contact information:   [place] [address] [phone]        The results of significant diagnostics from this hospitalization (including imaging, microbiology, ancillary and laboratory) are listed below for reference.    Significant Diagnostic Studies: Dg Chest 2 View  03/18/2015  CLINICAL DATA:  UNTREATED HTN. NO DIAB. NON SMOKER. NO HX MI OR STROKE. NO CP OR SOB. NO COUGH OR CONGESTION. NO  FEVER. NO N/V. LAST WEEK FELT RIGHT ARM TINGLING. EXAM: CHEST  2 VIEW COMPARISON:  01/11/2010 FINDINGS: Cardiac silhouette is mildly enlarged. Aorta is uncoiled. No mediastinal or hilar masses or evidence of adenopathy. Clear lungs.  No pleural effusion or pneumothorax. Skeletal structures are unremarkable. IMPRESSION: 1. No acute cardiopulmonary disease. 2. Mild stable cardiomegaly. Electronically Signed   By: Lajean Manes M.D.   On: 03/18/2015 18:05    Microbiology: Recent Results (from the past 240 hour(s))  MRSA PCR Screening     Status: None   Collection Time: 03/18/15 11:17 PM  Result Value Ref Range Status   MRSA by PCR NEGATIVE NEGATIVE Final    Comment:        The GeneXpert MRSA Assay (FDA approved for NASAL specimens only), is one component of a comprehensive MRSA colonization surveillance program. It is not intended to diagnose MRSA infection nor to guide or monitor treatment for MRSA infections.      Labs: Basic Metabolic Panel:  Recent Labs Lab 03/18/15 1551 03/19/15 0621 03/20/15 0024  NA 142 140 140  K 3.4* 3.3* 3.5  CL 103 103 101  CO2 '25 26 26  ' GLUCOSE 139* 107* 104*  BUN 22* 20 20  CREATININE 1.51* 1.41* 1.48*  CALCIUM 9.3 9.0 9.2   Liver Function Tests: No results for input(s): AST, ALT, ALKPHOS, BILITOT, PROT, ALBUMIN in the last 168 hours. No results for input(s): LIPASE, AMYLASE in the last 168 hours. No results for input(s): AMMONIA in the last 168 hours. CBC:  Recent Labs Lab 03/18/15 1551 03/19/15 0621  WBC 6.8 5.9  HGB 15.6 13.8  HCT 48.3 43.1  MCV 79.2 79.5  PLT 256 217   Cardiac Enzymes:  Recent Labs Lab 03/19/15 0034 03/19/15 0621 03/19/15 1121 03/19/15 1840 03/20/15 0024  TROPONINI 0.09* 0.09* 0.09* 0.08* 0.10*   BNP: BNP (last 3 results)  Recent Labs  03/18/15 1717  BNP 212.1*    ProBNP (last 3 results) No results for input(s): PROBNP in the last 8760 hours.  CBG: No results for input(s): GLUCAP in the last  168 hours.   Signed:  Charlynne Cousins MD.  [facility] 03/20/2015, 9:00 AM

## 2015-03-21 LAB — ALDOSTERONE + RENIN ACTIVITY W/ RATIO
ALDO / PRA RATIO: 0.5 (ref 0.0–30.0)
Aldosterone: 3.2 ng/dL (ref 0.0–30.0)
PRA LC/MS/MS: 6.438 ng/mL/hr — ABNORMAL HIGH (ref 0.167–5.380)

## 2016-03-15 DIAGNOSIS — E782 Mixed hyperlipidemia: Secondary | ICD-10-CM | POA: Insufficient documentation

## 2016-04-20 ENCOUNTER — Other Ambulatory Visit: Payer: Self-pay | Admitting: Cardiology

## 2016-04-20 DIAGNOSIS — I10 Essential (primary) hypertension: Secondary | ICD-10-CM

## 2016-04-26 ENCOUNTER — Ambulatory Visit
Admission: RE | Admit: 2016-04-26 | Discharge: 2016-04-26 | Disposition: A | Discharge status: Home / Self Care | Payer: 59 | Source: Ambulatory Visit | Attending: Cardiology | Admitting: Cardiology

## 2016-04-26 DIAGNOSIS — I10 Essential (primary) hypertension: Secondary | ICD-10-CM

## 2016-04-26 MED ORDER — IOPAMIDOL (ISOVUE-370) INJECTION 76%
75.0000 mL | Freq: Once | INTRAVENOUS | Status: AC | PRN
  Administered 2016-04-26: 75 mL via INTRAVENOUS

## 2017-03-12 ENCOUNTER — Emergency Department (HOSPITAL_COMMUNITY): Payer: 59

## 2017-03-12 ENCOUNTER — Inpatient Hospital Stay (HOSPITAL_COMMUNITY)
Admission: EM | Admit: 2017-03-12 | Discharge: 2017-03-19 | DRG: 064 | Disposition: A | Discharge status: Home Health | Payer: 59 | Attending: Family Medicine | Admitting: Family Medicine

## 2017-03-12 ENCOUNTER — Encounter (HOSPITAL_COMMUNITY): Payer: Self-pay

## 2017-03-12 ENCOUNTER — Other Ambulatory Visit: Payer: Self-pay

## 2017-03-12 DIAGNOSIS — E872 Acidosis: Secondary | ICD-10-CM | POA: Diagnosis present

## 2017-03-12 DIAGNOSIS — R0602 Shortness of breath: Secondary | ICD-10-CM

## 2017-03-12 DIAGNOSIS — E861 Hypovolemia: Secondary | ICD-10-CM | POA: Diagnosis not present

## 2017-03-12 DIAGNOSIS — R571 Hypovolemic shock: Secondary | ICD-10-CM | POA: Diagnosis present

## 2017-03-12 DIAGNOSIS — E871 Hypo-osmolality and hyponatremia: Secondary | ICD-10-CM | POA: Diagnosis present

## 2017-03-12 DIAGNOSIS — E785 Hyperlipidemia, unspecified: Secondary | ICD-10-CM | POA: Diagnosis present

## 2017-03-12 DIAGNOSIS — Z79899 Other long term (current) drug therapy: Secondary | ICD-10-CM | POA: Diagnosis not present

## 2017-03-12 DIAGNOSIS — I6330 Cerebral infarction due to thrombosis of unspecified cerebral artery: Secondary | ICD-10-CM | POA: Diagnosis not present

## 2017-03-12 DIAGNOSIS — I1310 Hypertensive heart and chronic kidney disease without heart failure, with stage 1 through stage 4 chronic kidney disease, or unspecified chronic kidney disease: Secondary | ICD-10-CM | POA: Diagnosis present

## 2017-03-12 DIAGNOSIS — Z86718 Personal history of other venous thrombosis and embolism: Secondary | ICD-10-CM | POA: Diagnosis not present

## 2017-03-12 DIAGNOSIS — E860 Dehydration: Secondary | ICD-10-CM | POA: Diagnosis present

## 2017-03-12 DIAGNOSIS — I6340 Cerebral infarction due to embolism of unspecified cerebral artery: Principal | ICD-10-CM | POA: Diagnosis present

## 2017-03-12 DIAGNOSIS — N183 Chronic kidney disease, stage 3 (moderate): Secondary | ICD-10-CM | POA: Diagnosis present

## 2017-03-12 DIAGNOSIS — E669 Obesity, unspecified: Secondary | ICD-10-CM | POA: Diagnosis present

## 2017-03-12 DIAGNOSIS — Z6836 Body mass index (BMI) 36.0-36.9, adult: Secondary | ICD-10-CM

## 2017-03-12 DIAGNOSIS — G9341 Metabolic encephalopathy: Secondary | ICD-10-CM | POA: Diagnosis not present

## 2017-03-12 DIAGNOSIS — R4182 Altered mental status, unspecified: Secondary | ICD-10-CM | POA: Diagnosis not present

## 2017-03-12 DIAGNOSIS — I248 Other forms of acute ischemic heart disease: Secondary | ICD-10-CM | POA: Diagnosis present

## 2017-03-12 DIAGNOSIS — I9589 Other hypotension: Secondary | ICD-10-CM | POA: Diagnosis not present

## 2017-03-12 DIAGNOSIS — Z7982 Long term (current) use of aspirin: Secondary | ICD-10-CM

## 2017-03-12 DIAGNOSIS — D631 Anemia in chronic kidney disease: Secondary | ICD-10-CM | POA: Diagnosis present

## 2017-03-12 DIAGNOSIS — Z8673 Personal history of transient ischemic attack (TIA), and cerebral infarction without residual deficits: Secondary | ICD-10-CM

## 2017-03-12 DIAGNOSIS — I472 Ventricular tachycardia: Secondary | ICD-10-CM | POA: Diagnosis not present

## 2017-03-12 DIAGNOSIS — R29701 NIHSS score 1: Secondary | ICD-10-CM | POA: Diagnosis present

## 2017-03-12 DIAGNOSIS — I6389 Other cerebral infarction: Secondary | ICD-10-CM | POA: Diagnosis not present

## 2017-03-12 DIAGNOSIS — N179 Acute kidney failure, unspecified: Secondary | ICD-10-CM | POA: Insufficient documentation

## 2017-03-12 DIAGNOSIS — J111 Influenza due to unidentified influenza virus with other respiratory manifestations: Secondary | ICD-10-CM

## 2017-03-12 DIAGNOSIS — R579 Shock, unspecified: Secondary | ICD-10-CM | POA: Diagnosis not present

## 2017-03-12 LAB — CBC WITH DIFFERENTIAL/PLATELET
Basophils Absolute: 0 10*3/uL (ref 0.0–0.1)
Basophils Relative: 0 %
Eosinophils Absolute: 0 10*3/uL (ref 0.0–0.7)
Eosinophils Relative: 0 %
HCT: 30.9 % — ABNORMAL LOW (ref 39.0–52.0)
Hemoglobin: 10.4 g/dL — ABNORMAL LOW (ref 13.0–17.0)
LYMPHS ABS: 0.9 10*3/uL (ref 0.7–4.0)
LYMPHS PCT: 14 %
MCH: 27.2 pg (ref 26.0–34.0)
MCHC: 33.7 g/dL (ref 30.0–36.0)
MCV: 80.9 fL (ref 78.0–100.0)
MONOS PCT: 9 %
Monocytes Absolute: 0.7 10*3/uL (ref 0.1–1.0)
Neutro Abs: 5.3 10*3/uL (ref 1.7–7.7)
Neutrophils Relative %: 77 %
PLATELETS: 220 10*3/uL (ref 150–400)
RBC: 3.82 MIL/uL — AB (ref 4.22–5.81)
RDW: 14.4 % (ref 11.5–15.5)
WBC: 6.9 10*3/uL (ref 4.0–10.5)

## 2017-03-12 LAB — URINALYSIS, ROUTINE W REFLEX MICROSCOPIC
Bilirubin Urine: NEGATIVE
Glucose, UA: NEGATIVE mg/dL
KETONES UR: NEGATIVE mg/dL
Leukocytes, UA: NEGATIVE
Nitrite: NEGATIVE
PH: 5 (ref 5.0–8.0)
Protein, ur: >=300 mg/dL — AB
SPECIFIC GRAVITY, URINE: 1.015 (ref 1.005–1.030)

## 2017-03-12 LAB — COMPREHENSIVE METABOLIC PANEL
ALBUMIN: 3.7 g/dL (ref 3.5–5.0)
ALT: 75 U/L — ABNORMAL HIGH (ref 17–63)
AST: 67 U/L — AB (ref 15–41)
Alkaline Phosphatase: 46 U/L (ref 38–126)
Anion gap: 14 (ref 5–15)
BUN: 112 mg/dL — AB (ref 6–20)
CHLORIDE: 108 mmol/L (ref 101–111)
CO2: 18 mmol/L — AB (ref 22–32)
CREATININE: 6.54 mg/dL — AB (ref 0.61–1.24)
Calcium: 8.2 mg/dL — ABNORMAL LOW (ref 8.9–10.3)
GFR calc Af Amer: 11 mL/min — ABNORMAL LOW (ref >60)
GFR calc non Af Amer: 9 mL/min — ABNORMAL LOW (ref >60)
GLUCOSE: 144 mg/dL — AB (ref 65–99)
POTASSIUM: 4.1 mmol/L (ref 3.5–5.1)
SODIUM: 140 mmol/L (ref 135–145)
Total Bilirubin: 0.8 mg/dL (ref 0.3–1.2)
Total Protein: 6.3 g/dL — ABNORMAL LOW (ref 6.5–8.1)

## 2017-03-12 LAB — I-STAT CG4 LACTIC ACID, ED
LACTIC ACID, VENOUS: 2.05 mmol/L — AB (ref 0.5–1.9)
Lactic Acid, Venous: 1.52 mmol/L (ref 0.5–1.9)

## 2017-03-12 LAB — LACTIC ACID, PLASMA: LACTIC ACID, VENOUS: 1.1 mmol/L (ref 0.5–1.9)

## 2017-03-12 LAB — BASIC METABOLIC PANEL
Anion gap: 12 (ref 5–15)
BUN: 96 mg/dL — AB (ref 6–20)
CALCIUM: 7.8 mg/dL — AB (ref 8.9–10.3)
CO2: 20 mmol/L — ABNORMAL LOW (ref 22–32)
CREATININE: 5.1 mg/dL — AB (ref 0.61–1.24)
Chloride: 111 mmol/L (ref 101–111)
GFR, EST AFRICAN AMERICAN: 14 mL/min — AB (ref >60)
GFR, EST NON AFRICAN AMERICAN: 12 mL/min — AB (ref >60)
Glucose, Bld: 121 mg/dL — ABNORMAL HIGH (ref 65–99)
Potassium: 4.1 mmol/L (ref 3.5–5.1)
SODIUM: 143 mmol/L (ref 135–145)

## 2017-03-12 LAB — CBG MONITORING, ED: Glucose-Capillary: 97 mg/dL (ref 65–99)

## 2017-03-12 LAB — RAPID URINE DRUG SCREEN, HOSP PERFORMED
AMPHETAMINES: NOT DETECTED
Barbiturates: NOT DETECTED
Benzodiazepines: NOT DETECTED
COCAINE: NOT DETECTED
OPIATES: NOT DETECTED
TETRAHYDROCANNABINOL: NOT DETECTED

## 2017-03-12 LAB — TSH: TSH: 1.291 u[IU]/mL (ref 0.350–4.500)

## 2017-03-12 LAB — MAGNESIUM: Magnesium: 2.1 mg/dL (ref 1.7–2.4)

## 2017-03-12 LAB — AMMONIA: AMMONIA: 34 umol/L (ref 9–35)

## 2017-03-12 LAB — LIPASE, BLOOD: Lipase: 38 U/L (ref 11–51)

## 2017-03-12 LAB — PROCALCITONIN: PROCALCITONIN: 0.6 ng/mL

## 2017-03-12 LAB — MRSA PCR SCREENING: MRSA by PCR: NEGATIVE

## 2017-03-12 LAB — ACETAMINOPHEN LEVEL: Acetaminophen (Tylenol), Serum: <10 ug/mL — ABNORMAL LOW (ref 10–30)

## 2017-03-12 LAB — PHOSPHORUS: PHOSPHORUS: 5.9 mg/dL — AB (ref 2.5–4.6)

## 2017-03-12 MED ORDER — SODIUM CHLORIDE 0.9 % IV BOLUS (SEPSIS)
30.0000 mL/kg | Freq: Once | INTRAVENOUS | Status: AC
  Administered 2017-03-12: 1000 mL via INTRAVENOUS

## 2017-03-12 MED ORDER — SODIUM CHLORIDE 0.9 % IV SOLN: 250.0000 mL | INTRAVENOUS | Status: DC | PRN

## 2017-03-12 MED ORDER — SODIUM CHLORIDE 0.9 % IV BOLUS (SEPSIS)
1000.0000 mL | Freq: Once | INTRAVENOUS | Status: AC
  Administered 2017-03-12: 1000 mL via INTRAVENOUS

## 2017-03-12 MED ORDER — SODIUM BICARBONATE 8.4 % IV SOLN
INTRAVENOUS | Status: DC
  Filled 2017-03-12: qty 1000

## 2017-03-12 MED ORDER — SODIUM CHLORIDE 0.9 % IV SOLN
1000.0000 mL | INTRAVENOUS | Status: DC
  Administered 2017-03-12 (×2): 1000 mL via INTRAVENOUS

## 2017-03-12 MED ORDER — LACTATED RINGERS IV BOLUS (SEPSIS)
2000.0000 mL | Freq: Once | INTRAVENOUS | Status: AC
  Administered 2017-03-12: 2000 mL via INTRAVENOUS

## 2017-03-12 MED ORDER — STERILE WATER FOR INJECTION IV SOLN
INTRAVENOUS | Status: DC
  Administered 2017-03-12: 15:00:00 via INTRAVENOUS
  Filled 2017-03-12: qty 9.71

## 2017-03-12 MED ORDER — HEPARIN SODIUM (PORCINE) 5000 UNIT/ML IJ SOLN
5000.0000 [IU] | Freq: Three times a day (TID) | INTRAMUSCULAR | Status: DC
  Administered 2017-03-12 – 2017-03-19 (×18): 5000 [IU] via SUBCUTANEOUS
  Filled 2017-03-12 (×19): qty 1

## 2017-03-12 MED ORDER — SODIUM CHLORIDE 0.9 % IV SOLN
0.0000 ug/min | INTRAVENOUS | Status: DC
  Administered 2017-03-12: 20 ug/min via INTRAVENOUS
  Filled 2017-03-12 (×4): qty 1

## 2017-03-12 MED ORDER — SODIUM CHLORIDE 0.9 % IV SOLN: 1000.0000 mL | INTRAVENOUS | Status: DC

## 2017-03-12 MED ORDER — IPRATROPIUM-ALBUTEROL 0.5-2.5 (3) MG/3ML IN SOLN: 3.0000 mL | Freq: Once | RESPIRATORY_TRACT | Status: DC

## 2017-03-12 MED ORDER — SODIUM BICARBONATE 8.4 % IV SOLN
INTRAVENOUS | Status: DC
  Administered 2017-03-12: 17:00:00 via INTRAVENOUS
  Filled 2017-03-12 (×3): qty 150

## 2017-03-12 NOTE — ED Notes (Signed)
RN at bedside

## 2017-03-12 NOTE — ED Notes (Signed)
CHARGE STACEY W RN AT BEDSIDE ASSISTING WITH CARE

## 2017-03-12 NOTE — ED Notes (Signed)
BLOOD CULTURE X 1 5ML/ EACH RT HAND

## 2017-03-12 NOTE — ED Triage Notes (Signed)
Per [facility]- Pt resides at home. Pt lethargic. Pt upon sitting near syncopal. Hypotensive systolic 80's. NS 500cc infusing. No improvement with pt status. Pt c/o of flu like symptoms x 3 days. Poor intake. No urine output since yesterday.

## 2017-03-12 NOTE — ED Notes (Signed)
Patient transported to X-ray 

## 2017-03-12 NOTE — ED Notes (Signed)
Bed: WA12 Expected date:  Expected time:  Means of arrival:  Comments: EMS flu like symptoms 

## 2017-03-12 NOTE — ED Notes (Signed)
ADMISSION PROVIDER AT BEDSIDE 

## 2017-03-12 NOTE — ED Notes (Signed)
[facility]PA Provider at bedside.

## 2017-03-12 NOTE — H&P (Addendum)
PULMONARY / CRITICAL CARE MEDICINE   Name: Anthony Mcdaniel MRN: [id number] DOB: 05/06/1970    ADMISSION DATE:  03/12/2017 CONSULTATION DATE:  2/25   REFERRING MD:  Phiffer   CHIEF COMPLAINT:  Acute encephalopathy, AKI and metabolic acidosis   HISTORY OF PRESENT ILLNESS:   This is a 47 year old male patient who was diagnosed with influenza on 2/20 at an urgent care.  Leading up to that had to have a typical symptoms of cough, fever, weakness and fatigue.  Treatment had been supportive in nature, his p.o. intake has been poor, he had been taking his oral antihypertensives and diuretics up until 2/20 however his oral intake had been poor leading up to that.  Per his wife the following few days after being diagnosed he remained quite weak staying primarily in bed.  On the a.m. of 2/25 he was too weak to get out of bed, he was incoherent, and mumbling only.  Because of this EMS was called.  In the emergency room he was found to be hypotensive with systolic blood pressure in the 70s-80s, his serum bicarbonate was 18, his BUN was 112 and creatinine 6.54.  An venous blood gas was obtained showing a pH of 7.23, and serum bicarbonate 20.1.  He was given 4 L of normal saline with improvement in his blood pressure, CT of abdomen pelvis was obtained this was negative for pathology specifically No evidence of regional calculi or hydronephrosis.  Because of his acute encephalopathy as well as metabolic derangements and acute kidney injury critical care was asked to admit.  PAST MEDICAL HISTORY :  He  has a past medical history of Hypertension.  PAST SURGICAL HISTORY: He  has no past surgical history on file.  No Known Allergies  No current facility-administered medications on file prior to encounter.    Current Outpatient Medications on File Prior to Encounter  Medication Sig  . amLODipine (NORVASC) 10 MG tablet Take 1 tablet (10 mg total) by mouth daily.  Marland Kitchen aspirin EC 81 MG tablet Take 81 mg by mouth  daily.  Marland Kitchen atorvastatin (LIPITOR) 40 MG tablet Take 1 tablet (40 mg total) by mouth daily at 6 PM.  . BYSTOLIC 20 MG TABS Take 20 mg by mouth daily.  . Chlorphen-Pseudoephed-APAP (THERAFLU FLU/COLD PO) Take 15 mLs by mouth 2 (two) times daily as needed (COUGH/FLU).  . chlorthalidone (HYGROTON) 25 MG tablet Take 1 tablet (25 mg total) by mouth daily.  Marland Kitchen diltiazem (CARDIZEM CD) 360 MG 24 hr capsule Take 360 mg by mouth daily.  . fluticasone (FLONASE) 50 MCG/ACT nasal spray Place into both nostrils daily.  . hydrALAZINE (APRESOLINE) 50 MG tablet Take 50 mg by mouth 3 (three) times daily.  . hydrochlorothiazide (HYDRODIURIL) 25 MG tablet Take 25 mg by mouth daily.  . isosorbide mononitrate (IMDUR) 60 MG 24 hr tablet Take 60 mg by mouth daily.  Marland Kitchen losartan (COZAAR) 100 MG tablet Take 100 mg by mouth daily.  . minoxidil (LONITEN) 10 MG tablet Take 10 mg by mouth daily.  . promethazine-dextromethorphan (PROMETHAZINE-DM) 6.25-15 MG/5ML syrup Take 5 mLs by mouth every 6 (six) hours as needed for cough.  . isosorbide mononitrate (IMDUR) 120 MG 24 hr tablet Take 1 tablet (120 mg total) by mouth daily. (Patient not taking: Reported on 03/12/2017)  . metoprolol (LOPRESSOR) 100 MG tablet Take 1 tablet (100 mg total) by mouth 2 (two) times daily. (Patient not taking: Reported on 03/12/2017)  . spironolactone (ALDACTONE) 50 MG tablet Take 1 tablet (50  mg total) by mouth daily. (Patient not taking: Reported on 03/12/2017)    FAMILY HISTORY:  His has no family status information on file.    SOCIAL HISTORY: He  reports that  has never smoked. he has never used smokeless tobacco. He reports that he does not drink alcohol or use drugs.  REVIEW OF SYSTEMS:   Unable  SUBJECTIVE:  Slowly becoming more lucid  VITAL SIGNS: BP (!) 109/49   Pulse 71   Temp 98.5 F (36.9 C) (Rectal)   Resp (!) 21   Ht 6' (1.829 m)   Wt 266 lb (120.7 kg)   SpO2 96%   BMI 36.08 kg/m    HEMODYNAMICS:    VENTILATOR  SETTINGS:    INTAKE / OUTPUT: No intake/output data recorded.  PHYSICAL EXAMINATION: General: 47 year old African-American male, lying in stretcher, awake, lethargic, but improving slowly with therapy Neuro: Awake, speech slurred, able to state his name only.  He will follow commands, has generalized weakness HEENT: Normocephalic atraumatic no jugular venous distention mucous membranes are dry Cardiovascular: Regular rate and rhythm without murmur rub or gallop Lungs: Clear to auscultation without accessory use Abdomen: Soft nontender no organomegaly Musculoskeletal: Equal strength and bulk Skin: Warm and dry  LABS:  BMET Recent Labs  Lab 03/12/17 1053  NA 140  K 4.1  CL 108  CO2 18*  BUN 112*  CREATININE 6.54*  GLUCOSE 144*    Electrolytes Recent Labs  Lab 03/12/17 1053 03/12/17 1257  CALCIUM 8.2*  --   MG  --  2.1  PHOS  --  5.9*    CBC Recent Labs  Lab 03/12/17 1053  WBC 6.9  HGB 10.4*  HCT 30.9*  PLT 220    Coag's No results for input(s): APTT, INR in the last 168 hours.  Sepsis Markers Recent Labs  Lab 03/12/17 1102 03/12/17 1305  LATICACIDVEN 1.52 2.05*    ABG No results for input(s): PHART, PCO2ART, PO2ART in the last 168 hours.  Liver Enzymes Recent Labs  Lab 03/12/17 1053  AST 67*  ALT 75*  ALKPHOS 46  BILITOT 0.8  ALBUMIN 3.7    Cardiac Enzymes No results for input(s): TROPONINI, PROBNP in the last 168 hours.  Glucose Recent Labs  Lab 03/12/17 1319  GLUCAP 97    Imaging Ct Head Wo Contrast  Result Date: 03/12/2017 CLINICAL DATA:  Altered level of consciousness (LOC), lethargic EXAM: CT HEAD WITHOUT CONTRAST TECHNIQUE: Contiguous axial images were obtained from the base of the skull through the vertex without intravenous contrast. COMPARISON:  None. FINDINGS: Brain: No evidence of acute infarction, hemorrhage, hydrocephalus, extra-axial collection or mass lesion/mass effect. Periventricular white matter low attenuation  as can be seen with microvascular disease. Vascular: No hyperdense vessel or unexpected calcification. Skull: No osseous abnormality. Sinuses/Orbits: Visualized paranasal sinuses are clear. Visualized mastoid sinuses are clear. Visualized orbits demonstrate no focal abnormality. Other: None IMPRESSION: No acute intracranial pathology. Electronically Signed   By: Elige Ko   On: 03/12/2017 12:21   Dg Chest Port 1 View  Result Date: 03/12/2017 CLINICAL DATA:  Decreased oxygen saturation, altered mental status. EXAM: PORTABLE CHEST 1 VIEW COMPARISON:  Chest x-ray of March 18, 2015 FINDINGS: The lungs are adequately inflated and clear. The heart is mildly enlarged. The pulmonary vascularity is not engorged. The mediastinum is normal in width. There is no pleural effusion. The bony thorax is unremarkable. IMPRESSION: Cardiomegaly, stable. No pulmonary edema, pneumonia, nor other acute cardiopulmonary abnormality. Electronically Signed   By: Onalee Hua  [place] M.D.   On: 03/12/2017 11:54   Ct Renal Stone Study  Result Date: 03/12/2017 CLINICAL DATA:  Lethargic, near syncope, or p.o. intake. No urine output since yesterday. Altered mental status. EXAM: CT ABDOMEN AND PELVIS WITHOUT CONTRAST TECHNIQUE: Multidetector CT imaging of the abdomen and pelvis was performed following the standard protocol without IV contrast. COMPARISON:  CT abdomen dated 04/26/2016. FINDINGS: Lower chest: No acute abnormality. Hepatobiliary: No focal liver abnormality is seen. No gallstones, gallbladder wall thickening, or biliary dilatation. Pancreas: Unremarkable. No pancreatic ductal dilatation or surrounding inflammatory changes. Spleen: Normal in size without focal abnormality. Adrenals/Urinary Tract: Adrenal glands appear normal. Kidneys are unremarkable without mass, stone or hydronephrosis. No perinephric fluid. No ureteral or bladder calculi identified. Bladder is decompressed. Stomach/Bowel: No dilated large or small bowel loops. No  bowel wall thickening or evidence of bowel wall inflammation seen. Stomach appears normal, partially decompressed. Appendix is normal. Vascular/Lymphatic: Mild aortic atherosclerosis. No enlarged lymph nodes seen. Reproductive: Prostate is unremarkable. Suspected peri scrotal varicoceles bilaterally. Other: No free fluid or abscess collection. No free intraperitoneal air. Musculoskeletal: No acute or suspicious osseous finding. Superficial soft tissues are unremarkable. IMPRESSION: 1. No acute findings. No bowel obstruction or evidence of bowel wall inflammation. No evidence of acute solid organ abnormality. No renal or ureteral calculi seen. No hydronephrosis. 2. Aortic atherosclerosis. 3. Probable bilateral varicoceles. Electronically Signed   By: Bary RichardStan  Maynard M.D.   On: 03/12/2017 14:11     STUDIES:  CT abdomen/pelvis 2/25:1. No acute findings. No bowel obstruction or evidence of bowel wall inflammation. No evidence of acute solid organ abnormality. No renal or ureteral calculi seen. No hydronephrosis. 2. Aortic atherosclerosis. 3. Probable bilateral varicoceles  CULTURES: Blood cultures x2 2/25:>>>  ANTIBIOTICS:   SIGNIFICANT EVENTS:   LINES/TUBES:   DISCUSSION:  47 year old male patient recently diagnosed with influenza presents now dehydrated, volume depleted, and encephalopathic with acute kidney injury as well as non-anion gap metabolic acidosis.  Now status post 4 L of saline with what appears to be clinical improvement in cognition.  Will admit to the intensive care, continue IV hydration, replace Foley catheter, check serial chemistries, and continue supportive care.   ASSESSMENT / PLAN:   Acute renal failure: Suspect hemodynamic mediated with volume depletion/dehydration and concomitant antihypertensives Plan Aggressive IV fluids Serial chemistries Place Foley catheter Strict I&O Hold diuretics and antihypertensives  Non-anion gap metabolic acidosis in the setting of  acute kidney injury.  Also concomitant very mild anion gap acidosis in the setting of lactic acidosis Plan Repeat lactic acidosis Change IV fluids to D5 water with 3 amps of bicarb at 150 cc an hour Serial blood chemistries  Hypovolemic shock, he has been volume responsive, he does not meet SIRS criteria, do not think this represents septic shock Plan Continuing fluid resuscitation Will add Neo-Synephrine for mean arterial pressure greater than 65, at baseline he has fairly difficult to control hypertension with typical systolics in the 130s on multiple antihypertensives Follow-up blood cultures Check pro-calcitonin Hold off on antibiotics no infiltrate to suggest concomitant bacterial pulmonary infection  Recent influenza Plan Respiratory isolation Supportive care  Acute metabolic encephalopathy, multifactorial in the setting of acute kidney injury, hypotension, and acidosis Plan Supportive care Admit to intensive care  FAMILY  - Updates:  Wife updated at bedside - Inter-disciplinary family meet or Palliative Care meeting due by: March 2  My cct 45 min  Simonne MartinetPeter E Babcock ACNP-BC [facility] Pager # [phone] OR # [phone] if no answer  03/12/2017, 3:35 PM

## 2017-03-12 NOTE — ED Notes (Signed)
ED Provider at bedside. PFEIFER

## 2017-03-12 NOTE — ED Notes (Signed)
PT POSITIVE FOR FLU DX AT URGENT CARE ON SATURDAY

## 2017-03-12 NOTE — ED Notes (Signed)
ED Provider at bedside. 

## 2017-03-12 NOTE — ED Notes (Signed)
ED Provider at bedside. PFEIFFER AT BEDSIDE

## 2017-03-12 NOTE — ED Provider Notes (Signed)
[facility]-EMERGENCY DEPT Provider Note   CSN: [id number] Arrival date & time: 03/12/17  1026     History   Chief Complaint Chief Complaint  Patient presents with  . Altered Mental Status  . Hypotension  . Generalized Body Aches    HPI Anthony Mcdaniel is a 47 y.o. male.  HPI Patient's wife reports that patient had fever and flu symptoms at the beginning of last week.  She reports that he was seen and tested for influenza and tested positive.  At that time he had also mentioned some vague left-sided abdominal pain.  It was positional and did not seem to severe.  At that time it was thought likely to be abdominal wall strain secondary to coughing from influenza.  At the time he was seen, he had already had symptoms for a few days.  They were doing symptomatic treatment at home and was not started on Tamiflu.  Fever had resolved and none was present for the past several days.  Patient however was quite fatigued and spent a lot of time sleeping.  His wife reports yesterday they did watch a movie together but again he spent most of the day in bed.  This morning she was checking in on him and planning to go to work, she reports that he seemed more groggy and confused.  She tried to get him up out of bed and he was generally too weak and required assistance from family members.  He remained too weak to walk by himself and generally confused.  He was then brought to the emergency department.  Patient normally has hypertension.  His wife reports since he has been sick she has not been taking his antihypertensives.  She thinks likely last dose was 5 days ago. Past Medical History:  Diagnosis Date  . Hypertension     Patient Active Problem List   Diagnosis Date Noted  . Acute metabolic encephalopathy 03/12/2017  . Hypertensive emergency 03/18/2015  . Hypokalemia 03/18/2015  . Chronic renal disease, stage III (HCC) 03/18/2015  . Elevated troponin 03/18/2015    History  reviewed. No pertinent surgical history.     Home Medications    Prior to Admission medications   Medication Sig Start Date End Date Taking? Authorizing Provider  amLODipine (NORVASC) 10 MG tablet Take 1 tablet (10 mg total) by mouth daily. 03/20/15  Yes Marinda Elk, MD  aspirin EC 81 MG tablet Take 81 mg by mouth daily.   Yes [provider]  atorvastatin (LIPITOR) 40 MG tablet Take 1 tablet (40 mg total) by mouth daily at 6 PM. 03/20/15  Yes Marinda Elk, MD  BYSTOLIC 20 MG TABS Take 20 mg by mouth daily. 02/28/17  Yes [provider]  Chlorphen-Pseudoephed-APAP (THERAFLU FLU/COLD PO) Take 15 mLs by mouth 2 (two) times daily as needed (COUGH/FLU).   Yes [provider]  chlorthalidone (HYGROTON) 25 MG tablet Take 1 tablet (25 mg total) by mouth daily. 03/20/15  Yes Marinda Elk, MD  diltiazem (CARDIZEM CD) 360 MG 24 hr capsule Take 360 mg by mouth daily. 01/30/17  Yes [provider]  fluticasone (FLONASE) 50 MCG/ACT nasal spray Place into both nostrils daily.   Yes [provider]  hydrALAZINE (APRESOLINE) 50 MG tablet Take 50 mg by mouth 3 (three) times daily. 01/12/17  Yes [provider]  hydrochlorothiazide (HYDRODIURIL) 25 MG tablet Take 25 mg by mouth daily.   Yes [provider]  isosorbide mononitrate (IMDUR) 60  MG 24 hr tablet Take 60 mg by mouth daily.   Yes [provider]  losartan (COZAAR) 100 MG tablet Take 100 mg by mouth daily. 01/30/17  Yes [provider]  minoxidil (LONITEN) 10 MG tablet Take 10 mg by mouth daily. 01/30/17  Yes [provider]  promethazine-dextromethorphan (PROMETHAZINE-DM) 6.25-15 MG/5ML syrup Take 5 mLs by mouth every 6 (six) hours as needed for cough.   Yes [provider]  isosorbide mononitrate (IMDUR) 120 MG 24 hr tablet Take 1 tablet (120 mg total) by mouth daily. Patient not taking: Reported on 03/12/2017 03/20/15   Marinda Elk, MD  metoprolol (LOPRESSOR) 100 MG tablet Take 1 tablet (100 mg total) by mouth 2 (two) times daily. Patient not taking: Reported on 03/12/2017 03/20/15   Marinda Elk, MD  spironolactone (ALDACTONE) 50 MG tablet Take 1 tablet (50 mg total) by mouth daily. Patient not taking: Reported on 03/12/2017 03/20/15   Marinda Elk, MD    Family History No family history on file.  Social History Social History   Tobacco Use  . Smoking status: Never Smoker  . Smokeless tobacco: Never Used  Substance Use Topics  . Alcohol use: No  . Drug use: No     Allergies   Patient has no known allergies.   Review of Systems Review of Systems Cannot obtain review of systems from patient level 5 caveat confusion.  Physical Exam Updated Vital Signs BP (!) 108/53   Pulse 73   Temp 98.5 F (36.9 C) (Rectal)   Resp 19   Ht 6' (1.829 m)   Wt 120.7 kg (266 lb)   SpO2 98%   BMI 36.08 kg/m   Physical Exam  Constitutional:  Patient is well-nourished and well-developed.  He is very somnolent.  Color is good.  No respiratory distress.  HENT:  Head: Normocephalic and atraumatic.  Because membranes are dry.  Posterior oropharynx widely patent.  Patient has severe gingivitis.  Eyes: Conjunctivae and EOM are normal. Pupils are equal, round, and reactive to light.  Neck: Neck supple.  Cardiovascular:  Borderline bradycardia.  No gross rub murmur gallop.  Pulmonary/Chest: Effort normal and breath sounds normal.  No gross wheeze rhonchi or rale.  Abdominal:  Abdomen is completely soft.  There is no guarding.  Patient does endorse some discomfort to palpation of the lower abdomen.  Genitourinary:  Genitourinary Comments: Mild candidal rash in the scrotum\groin area.  However, no significant erythema and no maceration.  Musculoskeletal: He exhibits no edema or tenderness.  Patient does not have any peripheral edema.  Excellent condition of the feet and the legs.  Neurological:    Patient is somnolent.  He gives very brief responses to questions.  Patient is confused.  He does follow commands for grip strength bilaterally.  He will follow commands to elevate each leg off the bed independently.  Skin: Skin is warm and dry. No rash noted.     ED Treatments / Results  Labs (all labs ordered are listed, but only abnormal results are displayed) Labs Reviewed  COMPREHENSIVE METABOLIC PANEL - Abnormal; Notable for the following components:      Result Value   CO2 18 (*)    Glucose, Bld 144 (*)    BUN 112 (*)    Creatinine, Ser 6.54 (*)    Calcium 8.2 (*)    Total Protein 6.3 (*)    AST 67 (*)    ALT 75 (*)    GFR calc  non Af Amer 9 (*)    GFR calc Af Amer 11 (*)    All other components within normal limits  CBC WITH DIFFERENTIAL/PLATELET - Abnormal; Notable for the following components:   RBC 3.82 (*)    Hemoglobin 10.4 (*)    HCT 30.9 (*)    All other components within normal limits  URINALYSIS, ROUTINE W REFLEX MICROSCOPIC - Abnormal; Notable for the following components:   APPearance CLOUDY (*)    Hgb urine dipstick SMALL (*)    Protein, ur >=300 (*)    Bacteria, UA RARE (*)    Squamous Epithelial / LPF 0-5 (*)    All other components within normal limits  ACETAMINOPHEN LEVEL - Abnormal; Notable for the following components:   Acetaminophen (Tylenol), Serum <10 (*)    All other components within normal limits  BLOOD GAS, VENOUS - Abnormal; Notable for the following components:   pH, Ven 7.225 (*)    Acid-base deficit 7.0 (*)    All other components within normal limits  PHOSPHORUS - Abnormal; Notable for the following components:   Phosphorus 5.9 (*)    All other components within normal limits  I-STAT CG4 LACTIC ACID, ED - Abnormal; Notable for the following components:   Lactic Acid, Venous 2.05 (*)    All other components within normal limits  CULTURE, BLOOD (ROUTINE X 2)  CULTURE, BLOOD (ROUTINE X 2)  CULTURE, BLOOD (ROUTINE X 2)  CULTURE,  BLOOD (ROUTINE X 2)  AMMONIA  LIPASE, BLOOD  RAPID URINE DRUG SCREEN, HOSP PERFORMED  TSH  MAGNESIUM  BASIC METABOLIC PANEL  BASIC METABOLIC PANEL  HIV ANTIBODY (ROUTINE TESTING)  LACTIC ACID, PLASMA  LACTIC ACID, PLASMA  PROCALCITONIN  I-STAT CG4 LACTIC ACID, ED  CBG MONITORING, ED    EKG  EKG Interpretation  Date/Time:  Monday March 12 2017 10:40:43 EST Ventricular Rate:  61 PR Interval:    QRS Duration: 103 QT Interval:  434 QTC Calculation: 438 R Axis:   33 Text Interpretation:  Sinus rhythm Prolonged PR interval Abnrm T, consider ischemia, anterolateral lds ST elevation, consider anterior injury LVH with repolarization abnormality. present on old EKGs Confirmed by Arby Barrette [phone]) on 03/12/2017 11:20:05 AM       Radiology Ct Head Wo Contrast  Result Date: 03/12/2017 CLINICAL DATA:  Altered level of consciousness (LOC), lethargic EXAM: CT HEAD WITHOUT CONTRAST TECHNIQUE: Contiguous axial images were obtained from the base of the skull through the vertex without intravenous contrast. COMPARISON:  None. FINDINGS: Brain: No evidence of acute infarction, hemorrhage, hydrocephalus, extra-axial collection or mass lesion/mass effect. Periventricular white matter low attenuation as can be seen with microvascular disease. Vascular: No hyperdense vessel or unexpected calcification. Skull: No osseous abnormality. Sinuses/Orbits: Visualized paranasal sinuses are clear. Visualized mastoid sinuses are clear. Visualized orbits demonstrate no focal abnormality. Other: None IMPRESSION: No acute intracranial pathology. Electronically Signed   By: Elige Ko   On: 03/12/2017 12:21   Dg Chest Port 1 View  Result Date: 03/12/2017 CLINICAL DATA:  Decreased oxygen saturation, altered mental status. EXAM: PORTABLE CHEST 1 VIEW COMPARISON:  Chest x-ray of March 18, 2015 FINDINGS: The lungs are adequately inflated and clear. The heart is mildly enlarged. The pulmonary vascularity is not  engorged. The mediastinum is normal in width. There is no pleural effusion. The bony thorax is unremarkable. IMPRESSION: Cardiomegaly, stable. No pulmonary edema, pneumonia, nor other acute cardiopulmonary abnormality. Electronically Signed   By: David  [place] M.D.   On: 03/12/2017 11:54  Ct Renal Stone Study  Result Date: 03/12/2017 CLINICAL DATA:  Lethargic, near syncope, or p.o. intake. No urine output since yesterday. Altered mental status. EXAM: CT ABDOMEN AND PELVIS WITHOUT CONTRAST TECHNIQUE: Multidetector CT imaging of the abdomen and pelvis was performed following the standard protocol without IV contrast. COMPARISON:  CT abdomen dated 04/26/2016. FINDINGS: Lower chest: No acute abnormality. Hepatobiliary: No focal liver abnormality is seen. No gallstones, gallbladder wall thickening, or biliary dilatation. Pancreas: Unremarkable. No pancreatic ductal dilatation or surrounding inflammatory changes. Spleen: Normal in size without focal abnormality. Adrenals/Urinary Tract: Adrenal glands appear normal. Kidneys are unremarkable without mass, stone or hydronephrosis. No perinephric fluid. No ureteral or bladder calculi identified. Bladder is decompressed. Stomach/Bowel: No dilated large or small bowel loops. No bowel wall thickening or evidence of bowel wall inflammation seen. Stomach appears normal, partially decompressed. Appendix is normal. Vascular/Lymphatic: Mild aortic atherosclerosis. No enlarged lymph nodes seen. Reproductive: Prostate is unremarkable. Suspected peri scrotal varicoceles bilaterally. Other: No free fluid or abscess collection. No free intraperitoneal air. Musculoskeletal: No acute or suspicious osseous finding. Superficial soft tissues are unremarkable. IMPRESSION: 1. No acute findings. No bowel obstruction or evidence of bowel wall inflammation. No evidence of acute solid organ abnormality. No renal or ureteral calculi seen. No hydronephrosis. 2. Aortic atherosclerosis. 3. Probable  bilateral varicoceles. Electronically Signed   By: Bary RichardStan  Maynard M.D.   On: 03/12/2017 14:11    Procedures Procedures (including critical care time) CRITICAL CARE Performed by: Cristy FriedlanderMarchy Keyundra Fant   Total critical care time: 60 minutes  Critical care time was exclusive of separately billable procedures and treating other patients.  Critical care was necessary to treat or prevent imminent or life-threatening deterioration.  Critical care was time spent personally by me on the following activities: development of treatment plan with patient and/or surrogate as well as nursing, discussions with consultants, evaluation of patient's response to treatment, examination of patient, obtaining history from patient or surrogate, ordering and performing treatments and interventions, ordering and review of laboratory studies, ordering and review of radiographic studies, pulse oximetry and re-evaluation of patient's condition. Medications Ordered in ED Medications  lactated ringers bolus 2,000 mL (2,000 mLs Intravenous New Bag/Given 03/12/17 1426)  heparin injection 5,000 Units (not administered)  phenylephrine (NEO-SYNEPHRINE) 10 mg in sodium chloride 0.9 % 250 mL (0.04 mg/mL) infusion (not administered)  0.9 %  sodium chloride infusion (not administered)  sodium bicarbonate 150 mEq in dextrose 5 % 1,000 mL infusion (not administered)  sodium chloride 0.9 % bolus 1,000 mL (0 mLs Intravenous Stopped 03/12/17 1316)  sodium chloride 0.9 % bolus 1,000 mL (0 mLs Intravenous Stopped 03/12/17 1346)  sodium chloride 0.9 % bolus 3,621 mL (0 mL/kg  120.7 kg Intravenous Stopped 03/12/17 1421)     Initial Impression / Assessment and Plan / ED Course  I have reviewed the triage vital signs and the nursing notes.  Pertinent labs & imaging results that were available during my care of the patient were reviewed by me and considered in my medical decision making (see chart for details).    Consult: 13: 50 reviewed  with Dr. Marchelle Gearingamaswamy.  Suspect severe dehydration with probable renal injury from NSAIDs.  Counseled to administer additional 2 L of lactated Ringer's in addition to weight-based normal saline already administered.  Advised to start low dose sodium bicarbonate drip.  Patient will be evaluated by intensivist in the emergency department for admission.  Final Clinical Impressions(s) / ED Diagnoses   Final diagnoses:  Influenza  Acute renal failure,  unspecified acute renal failure type (HCC)  Dehydration  Hypotension due to hypovolemia   Presents as outlined above with kidney failure.  Appears most likely secondary to dehydration with first influenza illness.  CT scans do not show any acute anomalies.  Chest x-ray is grossly clear.  Hydration and respiratory support with oxygen.  Consultation to intensivist and admission.  Patient has shown improvement with improved blood pressure.  He has not developed any respiratory distress with hydration.  Mental status remains confused but awakens easily. ED Discharge Orders    None       Arby Barrette, MD 03/12/17 [id number]

## 2017-03-13 ENCOUNTER — Inpatient Hospital Stay (HOSPITAL_COMMUNITY): Payer: 59

## 2017-03-13 DIAGNOSIS — G9341 Metabolic encephalopathy: Secondary | ICD-10-CM

## 2017-03-13 DIAGNOSIS — R579 Shock, unspecified: Secondary | ICD-10-CM

## 2017-03-13 LAB — PHOSPHORUS
PHOSPHORUS: 2.7 mg/dL (ref 2.5–4.6)
Phosphorus: 3.3 mg/dL (ref 2.5–4.6)

## 2017-03-13 LAB — MAGNESIUM
Magnesium: 1.9 mg/dL (ref 1.7–2.4)
Magnesium: 2.1 mg/dL (ref 1.7–2.4)

## 2017-03-13 LAB — BASIC METABOLIC PANEL
ANION GAP: 12 (ref 5–15)
ANION GAP: 10 (ref 5–15)
ANION GAP: 13 (ref 5–15)
BUN: 77 mg/dL — ABNORMAL HIGH (ref 6–20)
BUN: 63 mg/dL — ABNORMAL HIGH (ref 6–20)
BUN: 47 mg/dL — ABNORMAL HIGH (ref 6–20)
CALCIUM: 8.3 mg/dL — AB (ref 8.9–10.3)
CHLORIDE: 107 mmol/L (ref 101–111)
CO2: 24 mmol/L (ref 22–32)
CO2: 32 mmol/L (ref 22–32)
CO2: 28 mmol/L (ref 22–32)
Calcium: 8.4 mg/dL — ABNORMAL LOW (ref 8.9–10.3)
Calcium: 9 mg/dL (ref 8.9–10.3)
Chloride: 109 mmol/L (ref 101–111)
Chloride: 109 mmol/L (ref 101–111)
Creatinine, Ser: 3.92 mg/dL — ABNORMAL HIGH (ref 0.61–1.24)
Creatinine, Ser: 2.89 mg/dL — ABNORMAL HIGH (ref 0.61–1.24)
Creatinine, Ser: 2.43 mg/dL — ABNORMAL HIGH (ref 0.61–1.24)
GFR calc Af Amer: 20 mL/min — ABNORMAL LOW (ref >60)
GFR calc Af Amer: 28 mL/min — ABNORMAL LOW (ref >60)
GFR calc non Af Amer: 17 mL/min — ABNORMAL LOW (ref >60)
GFR calc non Af Amer: 25 mL/min — ABNORMAL LOW (ref >60)
GFR, EST AFRICAN AMERICAN: 35 mL/min — AB (ref >60)
GFR, EST NON AFRICAN AMERICAN: 30 mL/min — AB (ref >60)
GLUCOSE: 129 mg/dL — AB (ref 65–99)
Glucose, Bld: 128 mg/dL — ABNORMAL HIGH (ref 65–99)
Glucose, Bld: 114 mg/dL — ABNORMAL HIGH (ref 65–99)
POTASSIUM: 3.9 mmol/L (ref 3.5–5.1)
POTASSIUM: 4.2 mmol/L (ref 3.5–5.1)
Potassium: 3.8 mmol/L (ref 3.5–5.1)
SODIUM: 151 mmol/L — AB (ref 135–145)
Sodium: 145 mmol/L (ref 135–145)
Sodium: 148 mmol/L — ABNORMAL HIGH (ref 135–145)

## 2017-03-13 LAB — CBC
HCT: 31.1 % — ABNORMAL LOW (ref 39.0–52.0)
Hemoglobin: 10.4 g/dL — ABNORMAL LOW (ref 13.0–17.0)
MCH: 27.2 pg (ref 26.0–34.0)
MCHC: 33.4 g/dL (ref 30.0–36.0)
MCV: 81.2 fL (ref 78.0–100.0)
PLATELETS: 223 10*3/uL (ref 150–400)
RBC: 3.83 MIL/uL — AB (ref 4.22–5.81)
RDW: 14.7 % (ref 11.5–15.5)
WBC: 6.9 10*3/uL (ref 4.0–10.5)

## 2017-03-13 LAB — BLOOD GAS, VENOUS
ACID-BASE DEFICIT: 7 mmol/L — AB (ref 0.0–2.0)
BICARBONATE: 20.6 mmol/L (ref 20.0–28.0)
DRAWN BY: 257881
O2 Saturation: 22.3 %
PCO2 VEN: 51.6 mmHg (ref 44.0–60.0)
Patient temperature: 98.6
pH, Ven: 7.225 — ABNORMAL LOW (ref 7.250–7.430)

## 2017-03-13 LAB — TROPONIN I: TROPONIN I: 0.25 ng/mL — AB (ref <0.03)

## 2017-03-13 LAB — HIV ANTIBODY (ROUTINE TESTING W REFLEX): HIV Screen 4th Generation wRfx: NONREACTIVE

## 2017-03-13 LAB — ECHOCARDIOGRAM COMPLETE
Height: 72 in
Weight: 4137.59 oz

## 2017-03-13 MED ORDER — MAGNESIUM SULFATE IN D5W 1-5 GM/100ML-% IV SOLN
1.0000 g | Freq: Once | INTRAVENOUS | Status: AC
  Administered 2017-03-13: 1 g via INTRAVENOUS
  Filled 2017-03-13: qty 100

## 2017-03-13 MED ORDER — PHENYLEPHRINE HCL-NACL 10-0.9 MG/250ML-% IV SOLN
INTRAVENOUS | Status: AC
  Administered 2017-03-13: 30 ug/min
  Filled 2017-03-13: qty 250

## 2017-03-13 MED ORDER — LACTATED RINGERS IV SOLN
INTRAVENOUS | Status: DC
Start: 2017-03-13 — End: 2017-03-15
  Administered 2017-03-13 (×2): 125 mL/h via INTRAVENOUS
  Administered 2017-03-14: 11:00:00 via INTRAVENOUS

## 2017-03-13 NOTE — Progress Notes (Signed)
  Echocardiogram 2D Echocardiogram has been performed.  Leta JunglingCooper, Zohan Shiflet M 03/13/2017, 2:48 PM

## 2017-03-13 NOTE — Progress Notes (Signed)
PULMONARY / CRITICAL CARE MEDICINE   Name: Anthony Mcdaniel MRN: [id number] DOB: 1970-12-06   PCP Mila Palmer, MD   ADMISSION DATE:  03/12/2017 CONSULTATION DATE:  2/25   REFERRING MD:  Phiffer   CHIEF COMPLAINT:  Acute encephalopathy, AKI and metabolic acidosis   BRIEF This is a 47 year old male patient who was diagnosed with influenza on 2/20 at an urgent care.  Leading up to that had to have a typical symptoms of cough, fever, weakness and fatigue.  Treatment had been supportive in nature, his p.o. intake has been poor, he had been taking his oral antihypertensives and diuretics up until 2/20 however his oral intake had been poor leading up to that.  Per his wife the following few days after being diagnosed he remained quite weak staying primarily in bed.  On the a.m. of 2/25 he was too weak to get out of bed, he was incoherent, and mumbling only.  Because of this EMS was called.  In the emergency room he was found to be hypotensive with systolic blood pressure in the 70s-80s, his serum bicarbonate was 18, his BUN was 112 and creatinine 6.54.  An venous blood gas was obtained showing a pH of 7.23, and serum bicarbonate 20.1.  He was given 4 L of normal saline with improvement in his blood pressure, CT of abdomen pelvis was obtained this was negative for pathology specifically No evidence of regional calculi or hydronephrosis.  Because of his acute encephalopathy as well as metabolic derangements and acute kidney injury critical care was asked to admit.   has a past medical history of Hypertension.   EVENTS 03/12/2017 - admit   SUBJECTIVE/OVERNIGHT/INTERVAL HX  03/13/2017 - after initial improvement in confusion no further improvement; stil confused. Remains on neo but diast low with sbp > 120. Making lot of urine; creat improved to 3.9. Remains without vent     VITAL SIGNS: BP (!) 135/16 (BP Location: Left Arm)   Pulse 70   Temp 99.9 F (37.7 C) (Oral)   Resp 16   Ht 6' (1.829 m)    Wt 117.3 kg (258 lb 9.6 oz)   SpO2 100%   BMI 35.07 kg/m   HEMODYNAMICS:    VENTILATOR SETTINGS:    INTAKE / OUTPUT: I/O last 3 completed shifts: In: 40981 [I.V.:6101; IV Piggyback:4000] Out: 8350 [Urine:8350]  PHYSICAL EXAMINATION:   General Appearance:    overweight  Head:    Normocephalic, without obvious abnormality, atraumatic  Eyes:    PERRL - yes, conjunctiva/corneas - clear      Ears:    Normal external ear canals, both ears  Nose:   NG tube - no but has Bushnell  Throat:  ETT TUBE - no , OG tube - no  Neck:   Supple,  No enlargement/tenderness/nodules     Lungs:     Clear to auscultation bilaterally, V  Chest wall:    No deformity  Heart:    S1 and S2 normal, no murmur, CVP - no.  Pressors - neo at 35  Abdomen:     Soft, no masses, no organomegaly  Genitalia:    Not done  Rectal:   not done  Extremities:   Extremities- intact      Skin:   Intact in exposed areas .      Neurologic:   Sedation - none -> RASS - -1 . Moves all 4s - yes. CAM-ICU - positive for delirum . Orientation - not oriented. Follows intermittent commands but  moans whenasked questions    PULMONARY Recent Labs  Lab 03/12/17 1328  HCO3 20.6  O2SAT 22.3    CBC Recent Labs  Lab 03/12/17 1053 03/13/17 0030  HGB 10.4* 10.4*  HCT 30.9* 31.1*  WBC 6.9 6.9  PLT 220 223    COAGULATION No results for input(s): INR in the last 168 hours.  CARDIAC  No results for input(s): TROPONINI in the last 168 hours. No results for input(s): PROBNP in the last 168 hours.   CHEMISTRY Recent Labs  Lab 03/12/17 1053 03/12/17 1257 03/12/17 1645 03/13/17 0030  NA 140  --  143 145  K 4.1  --  4.1 3.8  CL 108  --  111 109  CO2 18*  --  20* 24  GLUCOSE 144*  --  121* 129*  BUN 112*  --  96* 77*  CREATININE 6.54*  --  5.10* 3.92*  CALCIUM 8.2*  --  7.8* 8.3*  MG  --  2.1  --  1.9  PHOS  --  5.9*  --  3.3   Estimated Creatinine Clearance: 31.1 mL/min (A) (by C-G formula based on SCr of 3.92  mg/dL (H)).   LIVER Recent Labs  Lab 03/12/17 1053  AST 67*  ALT 75*  ALKPHOS 46  BILITOT 0.8  PROT 6.3*  ALBUMIN 3.7     INFECTIOUS Recent Labs  Lab 03/12/17 1102 03/12/17 1305 03/12/17 1644  LATICACIDVEN 1.52 2.05* 1.1  PROCALCITON  --   --  0.60     ENDOCRINE CBG (last 3)  Recent Labs    03/12/17 1319  GLUCAP 97         IMAGING x48h  - image(s) personally visualized  -   highlighted in bold Ct Head Wo Contrast  Result Date: 03/12/2017 CLINICAL DATA:  Altered level of consciousness (LOC), lethargic EXAM: CT HEAD WITHOUT CONTRAST TECHNIQUE: Contiguous axial images were obtained from the base of the skull through the vertex without intravenous contrast. COMPARISON:  None. FINDINGS: Brain: No evidence of acute infarction, hemorrhage, hydrocephalus, extra-axial collection or mass lesion/mass effect. Periventricular white matter low attenuation as can be seen with microvascular disease. Vascular: No hyperdense vessel or unexpected calcification. Skull: No osseous abnormality. Sinuses/Orbits: Visualized paranasal sinuses are clear. Visualized mastoid sinuses are clear. Visualized orbits demonstrate no focal abnormality. Other: None IMPRESSION: No acute intracranial pathology. Electronically Signed   By: Elige Ko   On: 03/12/2017 12:21   Dg Chest Port 1 View  Result Date: 03/12/2017 CLINICAL DATA:  Decreased oxygen saturation, altered mental status. EXAM: PORTABLE CHEST 1 VIEW COMPARISON:  Chest x-ray of March 18, 2015 FINDINGS: The lungs are adequately inflated and clear. The heart is mildly enlarged. The pulmonary vascularity is not engorged. The mediastinum is normal in width. There is no pleural effusion. The bony thorax is unremarkable. IMPRESSION: Cardiomegaly, stable. No pulmonary edema, pneumonia, nor other acute cardiopulmonary abnormality. Electronically Signed   By: David  [place] M.D.   On: 03/12/2017 11:54   Ct Renal Stone Study  Result Date:  03/12/2017 CLINICAL DATA:  Lethargic, near syncope, or p.o. intake. No urine output since yesterday. Altered mental status. EXAM: CT ABDOMEN AND PELVIS WITHOUT CONTRAST TECHNIQUE: Multidetector CT imaging of the abdomen and pelvis was performed following the standard protocol without IV contrast. COMPARISON:  CT abdomen dated 04/26/2016. FINDINGS: Lower chest: No acute abnormality. Hepatobiliary: No focal liver abnormality is seen. No gallstones, gallbladder wall thickening, or biliary dilatation. Pancreas: Unremarkable. No pancreatic ductal dilatation or surrounding inflammatory  changes. Spleen: Normal in size without focal abnormality. Adrenals/Urinary Tract: Adrenal glands appear normal. Kidneys are unremarkable without mass, stone or hydronephrosis. No perinephric fluid. No ureteral or bladder calculi identified. Bladder is decompressed. Stomach/Bowel: No dilated large or small bowel loops. No bowel wall thickening or evidence of bowel wall inflammation seen. Stomach appears normal, partially decompressed. Appendix is normal. Vascular/Lymphatic: Mild aortic atherosclerosis. No enlarged lymph nodes seen. Reproductive: Prostate is unremarkable. Suspected peri scrotal varicoceles bilaterally. Other: No free fluid or abscess collection. No free intraperitoneal air. Musculoskeletal: No acute or suspicious osseous finding. Superficial soft tissues are unremarkable. IMPRESSION: 1. No acute findings. No bowel obstruction or evidence of bowel wall inflammation. No evidence of acute solid organ abnormality. No renal or ureteral calculi seen. No hydronephrosis. 2. Aortic atherosclerosis. 3. Probable bilateral varicoceles. Electronically Signed   By: Bary RichardStan  Maynard M.D.   On: 03/12/2017 14:11       DISCUSSION:  47 year old male patient recently diagnosed with influenza presents now dehydrated, volume depleted, and encephalopathic with acute kidney injury as well as non-anion gap metabolic acidosis.  Now status post 4  L of saline with what appears to be clinical improvement in cognition.  Will admit to the intensive care, continue IV hydration, replace Foley catheter, check serial chemistries, and continue supportive care.   ASSESSMENT / PLAN:  ASSESSMENT / PLAN:  PULMONARY A: Admit:  Mild mixed resp acidosis at admission from renal faiure and encephalopathy at admit and at risk for intubation  03/13/2017 ->doing well without intubation  P:   monitor  CARDIOVASCULAR A:  #Baseline: Hypertensive @ baseline: On  Norvasc, asa 81, bystolic, cardizem, imdur, minoxidil, lopressor, aldoactor #current presented with circulatory shock due to dehydration  03/13/2017 -> ongouing pressor need but diast low  P:  Neo continue but change bp goal to sbp > 110 and MAP > 55 Get echo given hx of sevre bp  RENAL  Intake/Output Summary (Last 24 hours) at 03/13/2017 [id number] Last data filed at 03/13/2017 0600 Gross per 24 hour  Intake 9604510101 ml  Output 8350 ml  Net 1751 ml   Recent Labs  Lab 03/12/17 1053 03/12/17 1645 03/13/17 0030  CREATININE 6.54* 5.10* 3.92*     A:   #baseline: creat 1.48 in March 2017 #admit: Acute severe renal failure - presumed dheydration +/- severe hypertension issues (he is on many bp meds at home and ? Compliance)  03/13/2017 -> improving appears to be in poyuric phase. Mild low mag +  P:   Dc bicarb Replete mag 1g Continue fluids LR at 100cc/h  GASTROINTESTINAL A:   Encephalopathy precludes diet  P:   NPO  HEMATOLOGIC Recent Labs  Lab 03/12/17 1053 03/13/17 0030  HGB 10.4* 10.4*  HCT 30.9* 31.1*  WBC 6.9 6.9  PLT 220 223    A:   #RBC: anemia of critical illness #Platelet normal #WBC normal  P:  - PRBC for hgb </= 6.9gm%    - exceptions are   -  if ACS susepcted/confirmed then transfuse for hgb </= 8.0gm%,  or    -  active bleeding with hemodynamic instability, then transfuse regardless of hemoglobin value   At at all times try to transfuse 1 unit prbc  as possible with exception of active hemorrhage    INFECTIOUS Recent Labs  Lab 03/12/17 1644  PROCALCITON 0.60    Results for orders placed or performed during the hospital encounter of 03/12/17  MRSA PCR Screening     Status: None  Collection Time: 03/12/17  5:57 PM  Result Value Ref Range Status   MRSA by PCR NEGATIVE NEGATIVE Final    Comment:        The GeneXpert MRSA Assay (FDA approved for NASAL specimens only), is one component of a comprehensive MRSA colonization surveillance program. It is not intended to diagnose MRSA infection nor to guide or monitor treatment for MRSA infections. Performed at [facility], [address] [place], [place] [address]     A:   No evidence of infection P:   Anti-infectives (From admission, onward)   None       ENDOCRINE A:   Blood sugars ok   P:   monitor  NEUROLOGIC A:   #Baseline : normal  #Current: ongoing encephalopathy; slowly beter but plateaued off. CT head non contributory. Suspect renal issues as etiology and flu    P:   RASS goal: +1 (currently -1) Monitor Might need MRI /EEG at some point   FAMILY  - Updates: 03/13/2017 --> no family at bedside. D/w Bedside RN  - Inter-disciplinary family meet or Palliative Care meeting due by:  DAy 7. Current LOS is LOS 1 days   DISPO Keep in ICU    The patient is critically ill with multiple organ systems failure and requires high complexity decision making for assessment and support, frequent evaluation and titration of therapies, application of advanced monitoring technologies and extensive interpretation of multiple databases.   Critical Care Time devoted to patient care services described in this note is  35  Minutes. This time reflects time of care of this signee Dr Kalman Shan. This critical care time does not reflect procedure time, or teaching time or supervisory time of PA/NP/Med student/Med Resident etc but could involve care  discussion time    Dr. Kalman Shan, M.D., [facility].C.P Pulmonary and Critical Care Medicine Staff Physician [facility] Pager: [phone], If no answer or between  15:00h - 7:00h: call [phone]  03/13/2017 8:22 AM

## 2017-03-13 NOTE — Progress Notes (Signed)
T wave inversion on  ekg ECHo "great" oper RN as reported to her by tech  Plan Trop bmet Mag  Phos  Dr. Kalman ShanMurali Anuar Walgren, M.D., [facility].C.P Pulmonary and Critical Care Medicine Staff Physician, [facility] Center Director - Interstitial Lung Disease  Program  Pulmonary Fibrosis [facility] [place], [place], [id number]  Pager: [phone], If no answer or between  15:00h - 7:00h: call [phone] Telephone: [phone]

## 2017-03-13 NOTE — Progress Notes (Signed)
Initial Nutrition Assessment  DOCUMENTATION CODES:   Obesity unspecified  INTERVENTION:  - Diet advancement as medically feasible/as mentation permits. - RD will assess for nutrition-related needs at follow-up.   NUTRITION DIAGNOSIS:   Inadequate oral intake related to inability to eat as evidenced by NPO status.  GOAL:   Patient will meet greater than or equal to 90% of their needs  MONITOR:   Diet advancement, Weight trends, Labs  REASON FOR ASSESSMENT:   Malnutrition Screening Tool  ASSESSMENT:   47 year old male patient who was diagnosed with influenza on 2/20 at an urgent care.  Leading up to that had to have a typical symptoms of cough, fever, weakness and fatigue.  His p.o. intake has been poor.  Per his wife, the few days after being diagnosed he remained quite weak staying primarily in bed.  He was given 4 L of NS in the ED with improvement in his blood pressure.  BMI indicates obesity. Pt has been NPO since admission. Flow sheet indicates that pt is a/o to self only. No family/visitors present at this time. Pt replies "mhmm" to all statements and questions by RD. Pt kept eyes closed throughout RD visit.   Per review of chart, pt or wife had reported 12 lb weight loss in the past 1 week. Unable to verify this from the chart. Current weight of 137 lbs following 4 L bolus in the ED. Noted high rate IVF currently ordered. Will continue to monitor weight trends closely.   Medications reviewed; 1 g IV Mg sulfate x1 run today. Labs reviewed; Na: 148 mmol/L, BUN: 63 mg/dL, creatinine: 1.612.89 mg/dL, Ca: 8.4 mg/dL, GFR: 28 mL/min.   IVF: LR @ 125 mL/hr.     NUTRITION - FOCUSED PHYSICAL EXAM:  Completed/assessed with no muscle and no fat wasting noted.  Diet Order:  No diet orders on file  EDUCATION NEEDS:   No education needs have been identified at this time  Skin:  Skin Assessment: Reviewed RN Assessment  Last BM:  PTA/unknown  Height:   Ht Readings from Last 1  Encounters:  03/12/17 6' (1.829 m)    Weight:   Wt Readings from Last 1 Encounters:  03/13/17 258 lb 9.6 oz (117.3 kg)    Ideal Body Weight:  80.91 kg  BMI:  Body mass index is 35.07 kg/m.  Estimated Nutritional Needs:   Kcal:  0960-45401995-2230 (17-19 kcal/kg)  Protein:  117-130 grams  Fluid:  >/= 2.3 L/day      Trenton GammonJessica Karol Skarzynski, MS, RD, LDN, [facility] Inpatient Clinical Dietitian Pager # [phone] After hours/weekend pager # [phone]

## 2017-03-14 ENCOUNTER — Other Ambulatory Visit: Payer: Self-pay

## 2017-03-14 LAB — CBC WITH DIFFERENTIAL/PLATELET
BASOS PCT: 0 %
Basophils Absolute: 0 10*3/uL (ref 0.0–0.1)
EOS ABS: 0 10*3/uL (ref 0.0–0.7)
Eosinophils Relative: 0 %
HCT: 32.9 % — ABNORMAL LOW (ref 39.0–52.0)
HEMOGLOBIN: 10.4 g/dL — AB (ref 13.0–17.0)
LYMPHS ABS: 1.1 10*3/uL (ref 0.7–4.0)
Lymphocytes Relative: 20 %
MCH: 26.9 pg (ref 26.0–34.0)
MCHC: 31.6 g/dL (ref 30.0–36.0)
MCV: 85 fL (ref 78.0–100.0)
MONOS PCT: 12 %
Monocytes Absolute: 0.7 10*3/uL (ref 0.1–1.0)
NEUTROS ABS: 3.9 10*3/uL (ref 1.7–7.7)
NEUTROS PCT: 68 %
Platelets: 225 10*3/uL (ref 150–400)
RBC: 3.87 MIL/uL — AB (ref 4.22–5.81)
RDW: 14.6 % (ref 11.5–15.5)
WBC: 5.7 10*3/uL (ref 4.0–10.5)

## 2017-03-14 LAB — BASIC METABOLIC PANEL
ANION GAP: 9 (ref 5–15)
BUN: 40 mg/dL — ABNORMAL HIGH (ref 6–20)
CHLORIDE: 109 mmol/L (ref 101–111)
CO2: 31 mmol/L (ref 22–32)
Calcium: 9.1 mg/dL (ref 8.9–10.3)
Creatinine, Ser: 2.25 mg/dL — ABNORMAL HIGH (ref 0.61–1.24)
GFR calc non Af Amer: 33 mL/min — ABNORMAL LOW (ref >60)
GFR, EST AFRICAN AMERICAN: 38 mL/min — AB (ref >60)
Glucose, Bld: 116 mg/dL — ABNORMAL HIGH (ref 65–99)
POTASSIUM: 4.3 mmol/L (ref 3.5–5.1)
Sodium: 149 mmol/L — ABNORMAL HIGH (ref 135–145)

## 2017-03-14 LAB — HEPATIC FUNCTION PANEL
ALT: 102 U/L — AB (ref 17–63)
AST: 98 U/L — AB (ref 15–41)
Albumin: 4 g/dL (ref 3.5–5.0)
Alkaline Phosphatase: 59 U/L (ref 38–126)
BILIRUBIN DIRECT: 0.2 mg/dL (ref 0.1–0.5)
Indirect Bilirubin: 0.9 mg/dL (ref 0.3–0.9)
Total Bilirubin: 1.1 mg/dL (ref 0.3–1.2)
Total Protein: 7.3 g/dL (ref 6.5–8.1)

## 2017-03-14 LAB — TROPONIN I
TROPONIN I: 2.64 ng/mL — AB (ref <0.03)
Troponin I: 2.64 ng/mL (ref <0.03)

## 2017-03-14 LAB — PHOSPHORUS: PHOSPHORUS: 2.5 mg/dL (ref 2.5–4.6)

## 2017-03-14 LAB — MAGNESIUM: MAGNESIUM: 1.9 mg/dL (ref 1.7–2.4)

## 2017-03-14 MED ORDER — MAGNESIUM SULFATE 2 GM/50ML IV SOLN
2.0000 g | Freq: Once | INTRAVENOUS | Status: AC
  Administered 2017-03-14: 2 g via INTRAVENOUS
  Filled 2017-03-14: qty 50

## 2017-03-14 MED ORDER — ASPIRIN 300 MG RE SUPP
150.0000 mg | Freq: Every day | RECTAL | Status: DC
  Administered 2017-03-14: 150 mg via RECTAL
  Filled 2017-03-14 (×2): qty 1

## 2017-03-14 NOTE — Progress Notes (Addendum)
eLink Physician-Brief Progress Note Patient Name: Anthony CiscoKevin Mcdaniel DOB: 02/20/1970 MRN: [id number]   Date of Service  03/14/2017  HPI/Events of Note  Troponin = 0.25 --> 2.64. DDx ACS vs Sepsis vs renal dysfunction. Patient is on Phenylephrine IV infusion. Therefore, can't use B-Blocker.  eICU Interventions  Will order: 1. ASA Suppository 150 mg PR now and Q day.  2. 12 lead EKG now. 3. Continue to cycle Troponin.      Intervention Category Major Interventions: Other:  Mikahla Wisor Dennard Nipugene 03/14/2017, 4:21 AM

## 2017-03-14 NOTE — Progress Notes (Signed)
Dr. Donette LarrySomer notified of critical troponin.

## 2017-03-14 NOTE — Progress Notes (Signed)
Patient is transferred to 1417 from ICU. Admission VS is stable. Patient is AOX4 with a family at the bedside.

## 2017-03-14 NOTE — Progress Notes (Signed)
PULMONARY / CRITICAL CARE MEDICINE   Name: Anthony Mcdaniel MRN: [id number] DOB: 12/17/1970   PCP Mila PalmerWolters, Sharon, MD   ADMISSION DATE:  03/12/2017 CONSULTATION DATE:  2/25   REFERRING MD:  Phiffer   CHIEF COMPLAINT:  Acute encephalopathy, AKI and metabolic acidosis   BRIEF This is a 47 year old male patient who was diagnosed with influenza on 2/20 at an urgent care.  Leading up to that had to have a typical symptoms of cough, fever, weakness and fatigue.  Treatment had been supportive in nature, his p.o. intake has been poor, he had been taking his oral antihypertensives and diuretics up until 2/20 however his oral intake had been poor leading up to that.  Per his wife the following few days after being diagnosed he remained quite weak staying primarily in bed.  On the a.m. of 2/25 he was too weak to get out of bed, he was incoherent, and mumbling only.  Because of this EMS was called.  In the emergency room he was found to be hypotensive with systolic blood pressure in the 70s-80s, his serum bicarbonate was 18, his BUN was 112 and creatinine 6.54.  An venous blood gas was obtained showing a pH of 7.23, and serum bicarbonate 20.1.  He was given 4 L of normal saline with improvement in his blood pressure, CT of abdomen pelvis was obtained this was negative for pathology specifically No evidence of regional calculi or hydronephrosis.  Because of his acute encephalopathy as well as metabolic derangements and acute kidney injury critical care was asked to admit.   has a past medical history of Hypertension.   EVENTS 03/12/2017 - admit  03/13/17 - after initial improvement in confusion no further improvement; stil confused. Remains on neo but diast low with sbp > 120. Making lot of urine; creat improved to 3.9. Remains without vent . Trop 2.6 . ECHO ef 60% with gr 1 diast dysfn and no wall motion abnormalities   SUBJECTIVE/OVERNIGHT/INTERVAL HX 2/27 - off pressors, making urine, improving  encephalopathy. Dad at bedside. BP nopw up at MAP 100. Dad says patient compliant with bpmeds   VITAL SIGNS: BP (!) 151/71 (BP Location: Left Arm)   Pulse 78   Temp 98.8 F (37.1 C) (Oral)   Resp 15   Ht 6' (1.829 m)   Wt 114.5 kg (252 lb 6.8 oz)   SpO2 91%   BMI 34.24 kg/m   HEMODYNAMICS:    VENTILATOR SETTINGS:    INTAKE / OUTPUT: I/O last 3 completed shifts: In: 1744.6 [I.V.:1744.6] Out: [id number] [Urine:11800]  PHYSICAL EXAMINATION:     General Appearance:  Look better. obest  Head:    Normocephalic, without obvious abnormality, atraumatic  Eyes:    PERRL - yes, conjunctiva/corneas - clea5      Ears:    Normal external ear canals, both ears  Nose:   NG tube - no  Throat:  ETT TUBE - no , OG tube - no  Neck:   Supple,  No enlargement/tenderness/nodules     Lungs:     Clear to auscultation bilaterally,   Chest wall:    No deformity  Heart:    S1 and S2 normal, no murmur, CVP - no.  Pressors - no  Abdomen:     Soft, no masses, no organomegaly  Genitalia:    Not done  Rectal:   not done  Extremities:   Extremities- intact     Skin:   Intact in exposed areas .  Neurologic:   Sedation - none -> RASS - +1 . Moves all 4s - yes. CAM-ICU - positive for delirum . Orientation - partial +. Follows commands . Mouthing words better. More awake     PULMONARY Recent Labs  Lab 03/12/17 1328  HCO3 20.6  O2SAT 22.3    CBC Recent Labs  Lab 03/12/17 1053 03/13/17 0030 03/14/17 0259  HGB 10.4* 10.4* 10.4*  HCT 30.9* 31.1* 32.9*  WBC 6.9 6.9 5.7  PLT 220 223 225    COAGULATION No results for input(s): INR in the last 168 hours.  CARDIAC   Recent Labs  Lab 03/13/17 1747 03/14/17 0259 03/14/17 0713  TROPONINI 0.25* 2.64* 2.64*   No results for input(s): PROBNP in the last 168 hours.   CHEMISTRY Recent Labs  Lab 03/12/17 1257 03/12/17 1645 03/13/17 0030 03/13/17 0748 03/13/17 1747 03/13/17 2340 03/14/17 0259  NA  --  143 145 148* 151* 149*   --   K  --  4.1 3.8 3.9 4.2 4.3  --   CL  --  111 109 107 109 109  --   CO2  --  20* 24 28 32 31  --   GLUCOSE  --  121* 129* 128* 114* 116*  --   BUN  --  96* 77* 63* 47* 40*  --   CREATININE  --  5.10* 3.92* 2.89* 2.43* 2.25*  --   CALCIUM  --  7.8* 8.3* 8.4* 9.0 9.1  --   MG 2.1  --  1.9  --  2.1  --  1.9  PHOS 5.9*  --  3.3  --  2.7  --  2.5   Estimated Creatinine Clearance: 53.6 mL/min (A) (by C-G formula based on SCr of 2.25 mg/dL (H)).   LIVER Recent Labs  Lab 03/12/17 1053 03/14/17 0259  AST 67* 98*  ALT 75* 102*  ALKPHOS 46 59  BILITOT 0.8 1.1  PROT 6.3* 7.3  ALBUMIN 3.7 4.0     INFECTIOUS Recent Labs  Lab 03/12/17 1102 03/12/17 1305 03/12/17 1644  LATICACIDVEN 1.52 2.05* 1.1  PROCALCITON  --   --  0.60     ENDOCRINE CBG (last 3)  Recent Labs    03/12/17 1319  GLUCAP 97         IMAGING x48h  - image(s) personally visualized  -   highlighted in bold Ct Head Wo Contrast  Result Date: 03/12/2017 CLINICAL DATA:  Altered level of consciousness (LOC), lethargic EXAM: CT HEAD WITHOUT CONTRAST TECHNIQUE: Contiguous axial images were obtained from the base of the skull through the vertex without intravenous contrast. COMPARISON:  None. FINDINGS: Brain: No evidence of acute infarction, hemorrhage, hydrocephalus, extra-axial collection or mass lesion/mass effect. Periventricular white matter low attenuation as can be seen with microvascular disease. Vascular: No hyperdense vessel or unexpected calcification. Skull: No osseous abnormality. Sinuses/Orbits: Visualized paranasal sinuses are clear. Visualized mastoid sinuses are clear. Visualized orbits demonstrate no focal abnormality. Other: None IMPRESSION: No acute intracranial pathology. Electronically Signed   By: Elige Ko   On: 03/12/2017 12:21   Dg Chest Port 1 View  Result Date: 03/12/2017 CLINICAL DATA:  Decreased oxygen saturation, altered mental status. EXAM: PORTABLE CHEST 1 VIEW COMPARISON:  Chest  x-ray of March 18, 2015 FINDINGS: The lungs are adequately inflated and clear. The heart is mildly enlarged. The pulmonary vascularity is not engorged. The mediastinum is normal in width. There is no pleural effusion. The bony thorax is unremarkable. IMPRESSION: Cardiomegaly, stable. No  pulmonary edema, pneumonia, nor other acute cardiopulmonary abnormality. Electronically Signed   By: David  [place] M.D.   On: 03/12/2017 11:54   Ct Renal Stone Study  Result Date: 03/12/2017 CLINICAL DATA:  Lethargic, near syncope, or p.o. intake. No urine output since yesterday. Altered mental status. EXAM: CT ABDOMEN AND PELVIS WITHOUT CONTRAST TECHNIQUE: Multidetector CT imaging of the abdomen and pelvis was performed following the standard protocol without IV contrast. COMPARISON:  CT abdomen dated 04/26/2016. FINDINGS: Lower chest: No acute abnormality. Hepatobiliary: No focal liver abnormality is seen. No gallstones, gallbladder wall thickening, or biliary dilatation. Pancreas: Unremarkable. No pancreatic ductal dilatation or surrounding inflammatory changes. Spleen: Normal in size without focal abnormality. Adrenals/Urinary Tract: Adrenal glands appear normal. Kidneys are unremarkable without mass, stone or hydronephrosis. No perinephric fluid. No ureteral or bladder calculi identified. Bladder is decompressed. Stomach/Bowel: No dilated large or small bowel loops. No bowel wall thickening or evidence of bowel wall inflammation seen. Stomach appears normal, partially decompressed. Appendix is normal. Vascular/Lymphatic: Mild aortic atherosclerosis. No enlarged lymph nodes seen. Reproductive: Prostate is unremarkable. Suspected peri scrotal varicoceles bilaterally. Other: No free fluid or abscess collection. No free intraperitoneal air. Musculoskeletal: No acute or suspicious osseous finding. Superficial soft tissues are unremarkable. IMPRESSION: 1. No acute findings. No bowel obstruction or evidence of bowel wall  inflammation. No evidence of acute solid organ abnormality. No renal or ureteral calculi seen. No hydronephrosis. 2. Aortic atherosclerosis. 3. Probable bilateral varicoceles. Electronically Signed   By: Bary Richard M.D.   On: 03/12/2017 14:11       DISCUSSION:  47 year old male patient recently diagnosed with influenza presents now dehydrated, volume depleted, and encephalopathic with acute kidney injury as well as non-anion gap metabolic acidosis.  Now status post 4 L of saline with what appears to be clinical improvement in cognition.  Will admit to the intensive care, continue IV hydration, replace Foley catheter, check serial chemistries, and continue supportive care.   ASSESSMENT / PLAN:  ASSESSMENT / PLAN:  PULMONARY A: Admit:  Mild mixed resp acidosis at admission from renal faiure and encephalopathy at admit and at risk for intubation  03/14/2017 ->doing well without intubation  P:   monitor  CARDIOVASCULAR A:  #Baseline: Hypertensive @ baseline: On  Norvasc, asa 81, bystolic, cardizem, imdur, minoxidil, lopressor, aldoactor #current presented with circulatory shock due to dehydration  03/14/2017 -> off pressors. MAP now 100. Trop leak wth normal EF and no wall motion abn - suggestive of flase positive trop or stress  P:  OPD cards followup Restart BP meds slowly; avoid ace inhibitor   RENAL  Intake/Output Summary (Last 24 hours) at 03/14/2017 1015 Last data filed at 03/14/2017 0600 Gross per 24 hour  Intake 1382.08 ml  Output 5700 ml  Net -4317.92 ml   Recent Labs  Lab 03/12/17 1645 03/13/17 0030 03/13/17 0748 03/13/17 1747 03/13/17 2340  CREATININE 5.10* 3.92* 2.89* 2.43* 2.25*     A:   #baseline: creat 1.48 in March 2017 #admit: Acute severe renal failure - presumed dheydration +/- severe hypertension issues (he is on many bp meds at home and ? Compliance)  03/14/2017 -> improving with mild low mag +  P:   Replete mag 3mg  Continue fluids LR   But redue rate   GASTROINTESTINAL A:   Imrpoving Encephalopathy  P:   Heart healthy diet  HEMATOLOGIC Recent Labs  Lab 03/12/17 1053 03/13/17 0030 03/14/17 0259  HGB 10.4* 10.4* 10.4*  HCT 30.9* 31.1* 32.9*  WBC 6.9 6.9  5.7  PLT 220 223 225    A:   #RBC: anemia of critical illness #Platelet normal #WBC normal  P:  - PRBC for hgb </= 6.9gm%    - exceptions are   -  if ACS susepcted/confirmed then transfuse for hgb </= 8.0gm%,  or    -  active bleeding with hemodynamic instability, then transfuse regardless of hemoglobin value   At at all times try to transfuse 1 unit prbc as possible with exception of active hemorrhage    INFECTIOUS Recent Labs  Lab 03/12/17 1644  PROCALCITON 0.60    Results for orders placed or performed during the hospital encounter of 03/12/17  Culture, blood (routine x 2)     Status: None (Preliminary result)   Collection Time: 03/12/17 11:36 AM  Result Value Ref Range Status   Specimen Description   Final    BLOOD RIGHT HAND Performed at [facility], [address] [place], [place] [address]    Special Requests   Final    BOTTLES DRAWN AEROBIC AND ANAEROBIC Blood Culture adequate volume Performed at [facility], [address], [place], [place] [address]    Culture   Final    NO GROWTH < 24 HOURS Performed at [facility], [address], [place], [place] [address]    Report Status PENDING  Incomplete  Culture, blood (routine x 2)     Status: None (Preliminary result)   Collection Time: 03/12/17 12:56 PM  Result Value Ref Range Status   Specimen Description   Final    BLOOD RIGHT ANTECUBITAL Performed at [facility], [address], [place], [place] [address]    Special Requests   Final    BOTTLES DRAWN AEROBIC ONLY Blood Culture adequate volume Performed at [facility], [address] [place], [place] [address]    Culture   Final    NO  GROWTH < 24 HOURS Performed at [facility], [address] [place], [place] [address]    Report Status PENDING  Incomplete  MRSA PCR Screening     Status: None   Collection Time: 03/12/17  5:57 PM  Result Value Ref Range Status   MRSA by PCR NEGATIVE NEGATIVE Final    Comment:        The GeneXpert MRSA Assay (FDA approved for NASAL specimens only), is one component of a comprehensive MRSA colonization surveillance program. It is not intended to diagnose MRSA infection nor to guide or monitor treatment for MRSA infections. Performed at [facility], [address], [place], [place] [address]     A:   No evidence of infection P:   Anti-infectives (From admission, onward)   None       ENDOCRINE A:   Blood sugars ok   P:   monitor  NEUROLOGIC A:   #Baseline : normal  #Current: ongoing encephalopathy; improved a lot but still off baseline   P:   RASS goal: 0 to +1 (he is there 03/14/2017) Monitor Might or might not need MRI /EEG at some point depending on course   FAMILY  - Updates: 03/14/2017 --> dad at bedside and bedside RN  - Inter-disciplinary family meet or Palliative Care meeting due by:  DAy 7. Current LOS is LOS 2 days   DISPO Move to med surg.    Dr. Kalman Shan, M.D., [facility].C.P Pulmonary and Critical Care Medicine Staff Physician [facility]  [facility] Pager: [phone], If no answer or between  15:00h - 7:00h: call [phone]  03/14/2017 10:15 AM

## 2017-03-14 NOTE — Plan of Care (Signed)
  Pain Managment: General experience of comfort will improve 03/14/2017 2039 - Progressing by Herbert PunAddison, Niko Jakel Y, RN

## 2017-03-15 ENCOUNTER — Inpatient Hospital Stay (HOSPITAL_COMMUNITY)
Admission: EM | Admit: 2017-03-15 | Discharge: 2017-03-15 | Disposition: A | Discharge status: Home / Self Care | Payer: 59 | Source: Home / Self Care | Attending: Internal Medicine | Admitting: Internal Medicine

## 2017-03-15 ENCOUNTER — Inpatient Hospital Stay (HOSPITAL_COMMUNITY): Payer: 59

## 2017-03-15 LAB — PHOSPHORUS: Phosphorus: 3.3 mg/dL (ref 2.5–4.6)

## 2017-03-15 LAB — TSH: TSH: 1.747 u[IU]/mL (ref 0.350–4.500)

## 2017-03-15 LAB — CBC WITH DIFFERENTIAL/PLATELET
Basophils Absolute: 0 10*3/uL (ref 0.0–0.1)
Basophils Relative: 0 %
EOS PCT: 2 %
Eosinophils Absolute: 0.1 10*3/uL (ref 0.0–0.7)
HEMATOCRIT: 36.6 % — AB (ref 39.0–52.0)
HEMOGLOBIN: 11.7 g/dL — AB (ref 13.0–17.0)
LYMPHS ABS: 1.6 10*3/uL (ref 0.7–4.0)
LYMPHS PCT: 29 %
MCH: 27 pg (ref 26.0–34.0)
MCHC: 32 g/dL (ref 30.0–36.0)
MCV: 84.3 fL (ref 78.0–100.0)
Monocytes Absolute: 0.5 10*3/uL (ref 0.1–1.0)
Monocytes Relative: 9 %
NEUTROS ABS: 3.5 10*3/uL (ref 1.7–7.7)
Neutrophils Relative %: 60 %
PLATELETS: 279 10*3/uL (ref 150–400)
RBC: 4.34 MIL/uL (ref 4.22–5.81)
RDW: 14.4 % (ref 11.5–15.5)
WBC: 5.7 10*3/uL (ref 4.0–10.5)

## 2017-03-15 LAB — BASIC METABOLIC PANEL
ANION GAP: 12 (ref 5–15)
BUN: 25 mg/dL — AB (ref 6–20)
CHLORIDE: 106 mmol/L (ref 101–111)
CO2: 27 mmol/L (ref 22–32)
Calcium: 9.5 mg/dL (ref 8.9–10.3)
Creatinine, Ser: 1.71 mg/dL — ABNORMAL HIGH (ref 0.61–1.24)
GFR calc Af Amer: 54 mL/min — ABNORMAL LOW (ref >60)
GFR calc non Af Amer: 46 mL/min — ABNORMAL LOW (ref >60)
Glucose, Bld: 101 mg/dL — ABNORMAL HIGH (ref 65–99)
POTASSIUM: 4.5 mmol/L (ref 3.5–5.1)
Sodium: 145 mmol/L (ref 135–145)

## 2017-03-15 LAB — CK TOTAL AND CKMB (NOT AT ARMC)
CK TOTAL: 197 U/L (ref 49–397)
CK, MB: 3.3 ng/mL (ref 0.5–5.0)
Relative Index: 1.7 (ref 0.0–2.5)

## 2017-03-15 LAB — MAGNESIUM: Magnesium: 2.1 mg/dL (ref 1.7–2.4)

## 2017-03-15 LAB — VITAMIN B12: Vitamin B-12: 838 pg/mL (ref 180–914)

## 2017-03-15 LAB — AMMONIA: Ammonia: 28 umol/L (ref 9–35)

## 2017-03-15 LAB — TROPONIN I
TROPONIN I: 2.58 ng/mL — AB (ref <0.03)
TROPONIN I: 2.25 ng/mL — AB (ref <0.03)
Troponin I: 2.77 ng/mL (ref <0.03)

## 2017-03-15 LAB — T4, FREE: Free T4: 1.05 ng/dL (ref 0.61–1.12)

## 2017-03-15 MED ORDER — NEBIVOLOL HCL 5 MG PO TABS
5.0000 mg | ORAL_TABLET | Freq: Every day | ORAL | Status: DC
  Administered 2017-03-16 – 2017-03-19 (×4): 5 mg via ORAL
  Filled 2017-03-15 (×4): qty 1

## 2017-03-15 MED ORDER — NEBIVOLOL HCL 2.5 MG PO TABS
2.5000 mg | ORAL_TABLET | Freq: Once | ORAL | Status: AC
  Administered 2017-03-15: 2.5 mg via ORAL
  Filled 2017-03-15: qty 1

## 2017-03-15 MED ORDER — ISOSORBIDE MONONITRATE ER 30 MG PO TB24
30.0000 mg | ORAL_TABLET | Freq: Every day | ORAL | Status: DC
  Administered 2017-03-15 – 2017-03-19 (×5): 30 mg via ORAL
  Filled 2017-03-15 (×5): qty 1

## 2017-03-15 MED ORDER — LACTATED RINGERS IV SOLN
INTRAVENOUS | Status: DC
  Administered 2017-03-15: 08:00:00 via INTRAVENOUS

## 2017-03-15 MED ORDER — ATORVASTATIN CALCIUM 40 MG PO TABS
40.0000 mg | ORAL_TABLET | Freq: Every day | ORAL | Status: DC
  Administered 2017-03-15 – 2017-03-16 (×2): 40 mg via ORAL
  Filled 2017-03-15 (×3): qty 1

## 2017-03-15 MED ORDER — ASPIRIN EC 325 MG PO TBEC
325.0000 mg | DELAYED_RELEASE_TABLET | Freq: Every day | ORAL | Status: DC
  Administered 2017-03-15 – 2017-03-19 (×4): 325 mg via ORAL
  Filled 2017-03-15 (×5): qty 1

## 2017-03-15 MED ORDER — SODIUM CHLORIDE 0.9 % IV SOLN
INTRAVENOUS | Status: AC
  Administered 2017-03-15: 20:00:00 via INTRAVENOUS

## 2017-03-15 MED ORDER — NEBIVOLOL HCL 2.5 MG PO TABS
2.5000 mg | ORAL_TABLET | Freq: Every day | ORAL | Status: DC
  Administered 2017-03-15: 2.5 mg via ORAL
  Filled 2017-03-15: qty 1

## 2017-03-15 MED ORDER — SODIUM CHLORIDE 0.9 % IV SOLN
250.0000 mL | INTRAVENOUS | Status: DC
  Administered 2017-03-15: 250 mL via INTRAVENOUS
  Administered 2017-03-16: 1000 mL via INTRAVENOUS

## 2017-03-15 NOTE — H&P (View-Only) (Signed)
Reason for Consult:Troponin elevation, tachycardia  Referring Physician: Berle Mull, MD  Anthony Mcdaniel is an 47 y.o. male.  HPI:   47 year old with resistant hypertension, hyperlipidemia, morbid obesity, last seen by Dr. Einar Gip in the office in 05/2016, recently diagnosed with influenza on 02/20, admitted to [facility] on 03/12/2017 with generalized weakness. He was found to be hypotensive, transient circulatory shock, acute renal failure. Creatinine now improving. Patient had an episode of 39 beat ventricular tachycardia on 03/15/2017 morning. His troponin is currently had 2.77 NG/mL.  He is awake, alert, oriented 2. Wife is at bedside and provides collateral history. Patient denies any chest pain, shortness of breath. 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 He also works out every other day with weight training exercises. He is a lifetime nonsmoker, does not have any family history of CAD. He does however have uncontrolled hypertension and hyperlipidemia. EKG in the hospital shows sinus rhythm, LVH with T-wave inversion which were present on previous EKGs as well.  Patient's mental status has improved in last few days. However, he continues to be confused especially early in the morning. CT head on 03/12/2017 showed no acute intracranial abnormality. Patient has reportedly had an EEG performed, awaiting results. He is also going to have a brain MRI performed later today.  EKG 03/14/2017: Sinus rhythm 70 bpm. Normal axis. Normal conduction. Left ventricular hypertrophy. Lateral T-wave inversions cannot exclude ischemia.  Study Conclusions  - Left ventricle: The cavity size was normal. Wall thickness was   increased in a pattern of severe LVH. There was severe concentric   hypertrophy. The estimated ejection fraction was in the range of   65% to 70%. Wall motion was normal; there were no regional wall   motion  abnormalities. Doppler parameters are consistent with   abnormal left ventricular relaxation (grade 1 diastolic   dysfunction). - Left atrium: The atrium was mildly dilated. - No significant valvular abnormality.   Inadequate TR jet to estimate PASP. Normal right atrial pressure.  Past Medical History:  Diagnosis Date  . Hypertension     History reviewed. No pertinent surgical history.  No family history of CAD  Social History:  reports that  has never smoked. he has never used smokeless tobacco. He reports that he does not drink alcohol or use drugs.  Allergies: No Known Allergies  Medications: I have reviewed the patient's current medications.  Results for orders placed or performed during the hospital encounter of 03/12/17 (from the past 48 hour(s))  Basic metabolic panel     Status: Abnormal   Collection Time: 03/13/17  5:47 PM  Result Value Ref Range   Sodium 151 (H) 135 - 145 mmol/L   Potassium 4.2 3.5 - 5.1 mmol/L   Chloride 109 101 - 111 mmol/L   CO2 32 22 - 32 mmol/L   Glucose, Bld 114 (H) 65 - 99 mg/dL   BUN 47 (H) 6 - 20 mg/dL   Creatinine, Ser 2.43 (H) 0.61 - 1.24 mg/dL   Calcium 9.0 8.9 - 10.3 mg/dL   GFR calc non Af Amer 30 (L) >60 mL/min   GFR calc Af Amer 35 (L) >60 mL/min    Comment: (NOTE) The eGFR has been calculated using the CKD EPI equation. This calculation has not been validated in all clinical situations. eGFR's persistently <60 mL/min signify possible Chronic Kidney Disease.    Anion gap 10 5 - 15  Comment: Performed at [facility], [place] [address], [place], [place] [address]  Troponin I     Status: Abnormal   Collection Time: 03/13/17  5:47 PM  Result Value Ref Range   Troponin I 0.25 (HH) <0.03 ng/mL    Comment: CRITICAL RESULT CALLED TO, READ BACK BY AND VERIFIED WITH: MCFARLAND,M. RN _0  ON 02.26.19 BY COHEN,K Performed at [facility], [place] [address] [place], [place] [address]   Magnesium      Status: None   Collection Time: 03/13/17  5:47 PM  Result Value Ref Range   Magnesium 2.1 1.7 - 2.4 mg/dL    Comment: Performed at [facility], [place] [address] [place], [place] [address]  Phosphorus     Status: None   Collection Time: 03/13/17  5:47 PM  Result Value Ref Range   Phosphorus 2.7 2.5 - 4.6 mg/dL    Comment: Performed at [facility], [place] [address], [place], [place] [address]  Basic metabolic panel     Status: Abnormal   Collection Time: 03/13/17 11:40 PM  Result Value Ref Range   Sodium 149 (H) 135 - 145 mmol/L   Potassium 4.3 3.5 - 5.1 mmol/L   Chloride 109 101 - 111 mmol/L   CO2 31 22 - 32 mmol/L   Glucose, Bld 116 (H) 65 - 99 mg/dL   BUN 40 (H) 6 - 20 mg/dL   Creatinine, Ser 2.25 (H) 0.61 - 1.24 mg/dL   Calcium 9.1 8.9 - 10.3 mg/dL   GFR calc non Af Amer 33 (L) >60 mL/min   GFR calc Af Amer 38 (L) >60 mL/min    Comment: (NOTE) The eGFR has been calculated using the CKD EPI equation. This calculation has not been validated in all clinical situations. eGFR's persistently <60 mL/min signify possible Chronic Kidney Disease.    Anion gap 9 5 - 15    Comment: Performed at [facility], [place] [address] [place], [place] [address]  CBC with Differential/Platelet     Status: Abnormal   Collection Time: 03/14/17  2:59 AM  Result Value Ref Range   WBC 5.7 4.0 - 10.5 K/uL   RBC 3.87 (L) 4.22 - 5.81 MIL/uL   Hemoglobin 10.4 (L) 13.0 - 17.0 g/dL   HCT 32.9 (L) 39.0 - 52.0 %   MCV 85.0 78.0 - 100.0 fL   MCH 26.9 26.0 - 34.0 pg   MCHC 31.6 30.0 - 36.0 g/dL   RDW 14.6 11.5 - 15.5 %   Platelets 225 150 - 400 K/uL   Neutrophils Relative % 68 %   Neutro Abs 3.9 1.7 - 7.7 K/uL   Lymphocytes Relative 20 %   Lymphs Abs 1.1 0.7 - 4.0 K/uL   Monocytes Relative 12 %   Monocytes Absolute 0.7 0.1 - 1.0 K/uL   Eosinophils Relative 0 %   Eosinophils Absolute 0.0 0.0 - 0.7 K/uL   Basophils Relative 0 %   Basophils Absolute  0.0 0.0 - 0.1 K/uL    Comment: Performed at [facility], [place] [address] [place], [place] [address]  Magnesium     Status: None   Collection Time: 03/14/17  2:59 AM  Result Value Ref Range   Magnesium 1.9 1.7 - 2.4 mg/dL    Comment: Performed at [facility], [place] [address], [place], [place] [address]  Phosphorus     Status: None   Collection Time: 03/14/17  2:59 AM  Result Value Ref Range   Phosphorus 2.5 2.5 - 4.6 mg/dL    Comment: Performed at [facility], [place] [address], [place], [place] [address]  Hepatic function panel     Status: Abnormal   Collection Time: 03/14/17  2:59 AM  Result Value Ref Range   Total Protein 7.3 6.5 - 8.1 g/dL   Albumin 4.0 3.5 - 5.0 g/dL   AST 98 (H) 15 - 41 U/L   ALT 102 (H) 17 - 63 U/L   Alkaline Phosphatase 59 38 - 126 U/L   Total Bilirubin 1.1 0.3 - 1.2 mg/dL   Bilirubin, Direct 0.2 0.1 - 0.5 mg/dL   Indirect Bilirubin 0.9 0.3 - 0.9 mg/dL    Comment: Performed at [facility], [place] [address] [place], [place] [address]  Troponin I     Status: Abnormal   Collection Time: 03/14/17  2:59 AM  Result Value Ref Range   Troponin I 2.64 (HH) <0.03 ng/mL    Comment: DELTA CHECK NOTED CRITICAL RESULT CALLED TO, READ BACK BY AND VERIFIED WITH: MCFARLAND,M RN 2.27.19 _0  ZANDOC Performed at [facility], [place] [address] [place], [place] [address]   Troponin I     Status: Abnormal   Collection Time: 03/14/17  7:13 AM  Result Value Ref Range   Troponin I 2.64 (HH) <0.03 ng/mL    Comment: CRITICAL VALUE NOTED.  VALUE IS CONSISTENT WITH PREVIOUSLY REPORTED AND CALLED VALUE. Performed at [facility], [place] [address], [place], [place] [address]   Basic metabolic panel     Status: Abnormal   Collection Time: 03/15/17  4:42 AM  Result Value Ref Range   Sodium 145 135 - 145 mmol/L   Potassium 4.5 3.5 - 5.1 mmol/L   Chloride 106 101 - 111  mmol/L   CO2 27 22 - 32 mmol/L   Glucose, Bld 101 (H) 65 - 99 mg/dL   BUN 25 (H) 6 - 20 mg/dL   Creatinine, Ser 1.71 (H) 0.61 - 1.24 mg/dL   Calcium 9.5 8.9 - 10.3 mg/dL   GFR calc non Af Amer 46 (L) >60 mL/min   GFR calc Af Amer 54 (L) >60 mL/min    Comment: (NOTE) The eGFR has been calculated using the CKD EPI equation. This calculation has not been validated in all clinical situations. eGFR's persistently <60 mL/min signify possible Chronic Kidney Disease.    Anion gap 12 5 - 15    Comment: Performed at [facility], [place] [address] [place], [place] [address]  CBC with Differential/Platelet     Status: Abnormal   Collection Time: 03/15/17  4:42 AM  Result Value Ref Range   WBC 5.7 4.0 - 10.5 K/uL   RBC 4.34 4.22 - 5.81 MIL/uL   Hemoglobin 11.7 (L) 13.0 - 17.0 g/dL   HCT 36.6 (L) 39.0 - 52.0 %   MCV 84.3 78.0 - 100.0 fL   MCH 27.0 26.0 - 34.0 pg   MCHC 32.0 30.0 - 36.0 g/dL   RDW 14.4 11.5 - 15.5 %   Platelets 279 150 - 400 K/uL   Neutrophils Relative % 60 %   Neutro Abs 3.5 1.7 - 7.7 K/uL   Lymphocytes Relative 29 %   Lymphs Abs 1.6 0.7 - 4.0 K/uL   Monocytes Relative 9 %   Monocytes Absolute 0.5 0.1 - 1.0 K/uL   Eosinophils Relative 2 %   Eosinophils Absolute 0.1 0.0 - 0.7 K/uL  Basophils Relative 0 %   Basophils Absolute 0.0 0.0 - 0.1 K/uL    Comment: Performed at [facility], [place] [address] [place], [place] [address]  Magnesium     Status: None   Collection Time: 03/15/17  4:42 AM  Result Value Ref Range   Magnesium 2.1 1.7 - 2.4 mg/dL    Comment: Performed at [facility], [place] [address] [place], [place] [address]  Phosphorus     Status: None   Collection Time: 03/15/17  4:42 AM  Result Value Ref Range   Phosphorus 3.3 2.5 - 4.6 mg/dL    Comment: Performed at [facility], [place] [address] [place], [place] [address]  Troponin I (q 6hr x 3)     Status: Abnormal   Collection Time:  03/15/17  7:56 AM  Result Value Ref Range   Troponin I 2.77 (HH) <0.03 ng/mL    Comment: CRITICAL VALUE NOTED.  VALUE IS CONSISTENT WITH PREVIOUSLY REPORTED AND CALLED VALUE. Performed at [facility], [place] [address], [place], [place] [address]   CK total and CKMB (cardiac)not at [facility]     Status: None   Collection Time: 03/15/17  7:56 AM  Result Value Ref Range   Total CK 197 49 - 397 U/L   CK, MB 3.3 0.5 - 5.0 ng/mL   Relative Index 1.7 0.0 - 2.5    Comment: Performed at [place] [address], [place], [place] [address]  Vitamin B12     Status: None   Collection Time: 03/15/17 10:12 AM  Result Value Ref Range   Vitamin B-12 838 180 - 914 pg/mL    Comment: (NOTE) This assay is not validated for testing neonatal or myeloproliferative syndrome specimens for Vitamin B12 levels. Performed at [facility], [place] [address], [place], [place] [address]   TSH     Status: None   Collection Time: 03/15/17 10:12 AM  Result Value Ref Range   TSH 1.747 0.350 - 4.500 uIU/mL    Comment: Performed by a 3rd Generation assay with a functional sensitivity of <=0.01 uIU/mL. Performed at [facility], [place] [address] [place], [place] [address]   T4, free     Status: None   Collection Time: 03/15/17 10:12 AM  Result Value Ref Range   Free T4 1.05 0.61 - 1.12 ng/dL    Comment: (NOTE) Biotin ingestion may interfere with free T4 tests. If the results are inconsistent with the TSH level, previous test results, or the clinical presentation, then consider biotin interference. If needed, order repeat testing after stopping biotin. Performed at [facility], [place] [address], [place], [place] [address]   Ammonia     Status: None   Collection Time: 03/15/17 10:12 AM  Result Value Ref Range   Ammonia 28 9 - 35 umol/L    Comment: Performed at [facility], [place] [address] [place], [place] [address]  Troponin I (q 6hr x 3)      Status: Abnormal   Collection Time: 03/15/17  1:38 PM  Result Value Ref Range   Troponin I 2.58 (HH) <0.03 ng/mL    Comment: CRITICAL VALUE NOTED.  VALUE IS CONSISTENT WITH PREVIOUSLY REPORTED AND CALLED VALUE. Performed at [facility], [place] [address] [place], [place] [address]     No results found.  Review of Systems  Constitutional: Positive for malaise/fatigue (Improved).  HENT: Negative.   Eyes:  Negative.   Respiratory: Negative for cough and shortness of breath.   Cardiovascular: Negative for chest pain, palpitations, leg swelling and PND.  Gastrointestinal: Positive for abdominal pain (Resolved). Negative for blood in stool, nausea and vomiting.  Genitourinary: Negative.   Musculoskeletal: Negative.   Skin: Negative for rash.  Neurological:       Confusion, improving  Endo/Heme/Allergies: Does not bruise/bleed easily.  Psychiatric/Behavioral: The patient is not nervous/anxious.    Blood pressure (!) 154/88, pulse 66, temperature 98.5 F (36.9 C), temperature source Oral, resp. rate 16, height 6' (1.829 m), weight 117.3 kg (258 lb 9.6 oz), SpO2 98 %. Physical Exam  Nursing note and vitals reviewed. Constitutional: He appears well-developed and well-nourished.  Moderately obese  HENT:  Head: Normocephalic and atraumatic.  Eyes: Conjunctivae are normal. Pupils are equal, round, and reactive to light.  Neck: Normal range of motion. Neck supple. No JVD present.  Cardiovascular: Normal rate, regular rhythm, normal heart sounds and intact distal pulses. Exam reveals no gallop and no friction rub.  No murmur heard. No carotid bruit. Normal peripheral pulses.  Respiratory: Effort normal and breath sounds normal. He has no wheezes. He has no rales. He exhibits no tenderness.  GI: Soft. Bowel sounds are normal. He exhibits no distension. There is no tenderness.  Musculoskeletal: Normal range of motion. He exhibits no edema.  Neurological: He is alert. No  cranial nerve deficit.  Oriented to self and place. When asked about year, says "its 1920".  Skin: Skin is warm and dry.    Assessment: 47 year old African-American male  Troponin elevation and ventricular tachycardia: White supply demand mismatch as possible given patient's profound illness with flu, metabolic encephalopathy, and acute kidney injury; his ventricular tachycardia and typical rise and fall of troponins is concerning for non-STEMI. His risk factors are uncontrolled hypertension and hyperlipidemia.  Encephalopathy: Embolic versus ?PRES Uncontrolled hypertension Hyperlipidemia Acute kidney injury: Resolved Flu illness Anemia: No signs of GI bleeding. Improving.  Recommendations: Recommend continuing beta blocker, with increased dose of bisoprolol 5 mg as you're doing. Could increase bisoprolol up to 10 mg as tolerated. If MRI shows no bleeding, start on IV heparin. Continue aspirin, currently on 325 mg. I will plan on performing coronary angiogram and possible intervention as necessary, tomorrow 03/16/2017 afternoon.  Continue Lipitor 40 mg daily. Management of hypertension, as you're doing. IV hydration overnight in preparation for cardiac cath.   Manish J Patwardhan 03/15/2017, 5:20 PM    Manish Esther Hardy, MD [facility] Pager: [phone] Office: [phone] If no answer Cell [phone]

## 2017-03-15 NOTE — Procedures (Signed)
EEG Report  Clinical History:  Confusion, acute kidney injury, and metabolic acidosis.  Technical Summary:  A 19 channel digital EEG recording was performed using the 10-20 international system of electrode placement.  Bipolar and Referential montages were used.  The total recording time was approx 40 minutes.  Findings:  There is a posterior dominant rhythm of 9 Hz reactive to eye opening and closure.  No focal slowing is present.  Photic stimulation and hyperventilation are not performed.    He gets drowsy and brief stage N2  sleep is recorded without abnormalities.  There are no epileptiform discharges or electrographic seizures present.    Impression:  This is a normal EEG in the awake and brief sleep states.   Anthony [name] [name] [name], MS, MD

## 2017-03-15 NOTE — Progress Notes (Signed)
Report received from C. Addison, RN. No change from initial pm assessment. Will continue to monitor and follow the POC.  

## 2017-03-15 NOTE — Progress Notes (Signed)
[facility] Progress Note  Patient: Anthony CiscoKevin Bradner AOZ:[id number]:[id number]   PCP: Mila PalmerWolters, Sharon, MD DOB: 08/03/1970   DOA: 03/12/2017   DOS: 03/15/2017   Date of Service: the patient was seen and examined on 03/15/2017  Subjective: Denies any chest pain or abdominal pain.  Making any eye contact.  Focusing on TV programs.  No nausea no vomiting no fever no chills.  No other acute events overnight.  Brief hospital course: Pt. with PMH of uncontrolled hypertension; admitted on 03/12/2017, presented with complaint of confusion, was found to have acute encephalopathy, acute kidney injury as well as metabolic acidosis. Currently further plan is further workup of encephalopathy and follow cardiology recommendation.  Assessment and Plan: 1.  Acute kidney injury. Hyponatremia Likely from dehydration from influenza. Also possibility of severe hypotension causing ischemic injury. Patient was hydrated aggressively with significant improvement in renal function. Presented with serum creatinine of 6.5, baseline creatinine 1.48, today 1.71. After aggressive hydration patient is actually urinating aggressively as well. Currently 500 cc negative during the stay in the hospital. At present we will reduce fluid rate. CTA renal protocol negative for hydronephrosis.  2.  Accelerated hypertension. Hypotension with hypovolemic shock. Presented with severe dehydration required aggressive hydration as well as IV phenylephrine drip. Now off the pressor and the blood pressure is elevated. Patient appears to have uncontrolled hypertension at his baseline and is on multiple antihypertensive medications. At present will gradually resume his bisoprolol as well as in the oral monitor.  3.  Acute encephalopathy. Possibly metabolic in nature from presentation with severe hypotension. CT scan of the head unremarkable.  Metabolic workup also unremarkable.  Do not appear to have any active infection. We will get MRI brain  and EEG and if negative may require psychiatric consultation.  4.  Elevated troponin. Likely demand ischemia from hypotension followed by hypertension. Also poor clearance from acute on chronic kidney disease. Echocardiogram was performed, preserved EF without any wall motion abnormalities.  Patient denies having any chest pain. EKG unremarkable. Having multiple PVCs on telemetry. Cardiology consult for further input. Appreciate input.  5.  Anemia of chronic kidney disease. Monitor H&H.  Diet: Renal diet DVT Prophylaxis: subcutaneous Heparin  Advance goals of care discussion: Full code  Family Communication: family was present at bedside, at the time of interview. The pt provided permission to discuss medical plan with the family. Opportunity was given to ask question and all questions were answered satisfactorily.   Disposition:  Discharge to home.  Consultants: cardiology  PCCM primary  Procedures: Echocardiogram   Antibiotics: Anti-infectives (From admission, onward)   None       Objective: Physical Exam: Vitals:   03/14/17 1600 03/14/17 1816 03/14/17 2016 03/15/17 0602  BP:  (!) 142/82 (!) 153/58 (!) 154/88  Pulse:  76 73 66  Resp:  18 16 16   Temp: 97.9 F (36.6 C) 98.2 F (36.8 C) 100.3 F (37.9 C) 98.5 F (36.9 C)  TempSrc: Oral Oral Oral Oral  SpO2:  95% 98% 98%  Weight:  117.3 kg (258 lb 9.6 oz)    Height:  6' (1.829 m)      Intake/Output Summary (Last 24 hours) at 03/15/2017 1616 Last data filed at 03/15/2017 0603 Gross per 24 hour  Intake 870 ml  Output 1400 ml  Net -530 ml   Filed Weights   03/13/17 0356 03/14/17 0800 03/14/17 1816  Weight: 117.3 kg (258 lb 9.6 oz) 114.5 kg (252 lb 6.8 oz) 117.3 kg (258 lb 9.6 oz)  General: Alert, Awake and Oriented to Time, Place and Person. Appear in mild distress, affect blunted Eyes: PERRL, Conjunctiva normal ENT: Oral Mucosa clear moist. Neck: no JVD, no Abnormal Mass Or lumps Cardiovascular: S1 and S2  Present, no Murmur, Peripheral Pulses Present Respiratory: normal respiratory effort, Bilateral Air entry equal and Decreased, no use of accessory muscle, Clear to Auscultation, no Crackles, no wheezes Abdomen: Bowel Sound present, Soft and no tenderness, no hernia Skin: no redness, no Rash, no induration Extremities: no Pedal edema, no calf tenderness Neurologic: Grossly no focal neuro deficit. Bilaterally Equal motor strength Mental status AAOx2, speech normal, attention poor,  Cerebellar test normal finger nose finger. Gait not checked due to patient safety concerns.   Data Reviewed: CBC: Recent Labs  Lab 03/12/17 1053 03/13/17 0030 03/14/17 0259 03/15/17 0442  WBC 6.9 6.9 5.7 5.7  NEUTROABS 5.3  --  3.9 3.5  HGB 10.4* 10.4* 10.4* 11.7*  HCT 30.9* 31.1* 32.9* 36.6*  MCV 80.9 81.2 85.0 84.3  PLT 220 223 225 279   Basic Metabolic Panel: Recent Labs  Lab 03/12/17 1257  03/13/17 0030 03/13/17 0748 03/13/17 1747 03/13/17 2340 03/14/17 0259 03/15/17 0442  NA  --    < > 145 148* 151* 149*  --  145  K  --    < > 3.8 3.9 4.2 4.3  --  4.5  CL  --    < > 109 107 109 109  --  106  CO2  --    < > 24 28 32 31  --  27  GLUCOSE  --    < > 129* 128* 114* 116*  --  101*  BUN  --    < > 77* 63* 47* 40*  --  25*  CREATININE  --    < > 3.92* 2.89* 2.43* 2.25*  --  1.71*  CALCIUM  --    < > 8.3* 8.4* 9.0 9.1  --  9.5  MG 2.1  --  1.9  --  2.1  --  1.9 2.1  PHOS 5.9*  --  3.3  --  2.7  --  2.5 3.3   < > = values in this interval not displayed.    Liver Function Tests: Recent Labs  Lab 03/12/17 1053 03/14/17 0259  AST 67* 98*  ALT 75* 102*  ALKPHOS 46 59  BILITOT 0.8 1.1  PROT 6.3* 7.3  ALBUMIN 3.7 4.0   Recent Labs  Lab 03/12/17 1257  LIPASE 38   Recent Labs  Lab 03/12/17 1257 03/15/17 1012  AMMONIA 34 28   Coagulation Profile: No results for input(s): INR, PROTIME in the last 168 hours. Cardiac Enzymes: Recent Labs  Lab 03/13/17 1747 03/14/17 0259  03/14/17 0713 03/15/17 0756 03/15/17 1338  CKTOTAL  --   --   --  197  --   CKMB  --   --   --  3.3  --   TROPONINI 0.25* 2.64* 2.64* 2.77* 2.58*   BNP (last 3 results) No results for input(s): PROBNP in the last 8760 hours. CBG: Recent Labs  Lab 03/12/17 1319  GLUCAP 97   Studies: No results found.  Scheduled Meds: . aspirin EC  325 mg Oral Daily  . atorvastatin  40 mg Oral q1800  . heparin  5,000 Units Subcutaneous Q8H  . isosorbide mononitrate  30 mg Oral Daily  . [START ON 03/16/2017] nebivolol  5 mg Oral Daily   Continuous Infusions: . sodium chloride 250 mL (  03/15/17 1013)   PRN Meds:   Time spent: 35 minutes  Author: Lynden Oxford, MD [facility] Pager: [phone] 03/15/2017 4:16 PM  If 7PM-7AM, please contact night-coverage at [email or web address], password [facility]

## 2017-03-15 NOTE — Consult Note (Signed)
Reason for Consult:Troponin elevation, tachycardia  Referring Physician: Berle Mull, MD  Anthony Mcdaniel is an 47 y.o. male.  HPI:   47 year old with resistant hypertension, hyperlipidemia, morbid obesity, last seen by Dr. Einar Gip in the office in 05/2016, recently diagnosed with influenza on 02/20, admitted to [facility] on 03/12/2017 with generalized weakness. He was found to be hypotensive, transient circulatory shock, acute renal failure. Creatinine now improving. Patient had an episode of 39 beat ventricular tachycardia on 03/15/2017 morning. His troponin is currently had 2.77 NG/mL.  He is awake, alert, oriented 2. Wife is at bedside and provides collateral history. Patient denies any chest pain, shortness of breath. 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 He also works out every other day with weight training exercises. He is a lifetime nonsmoker, does not have any family history of CAD. He does however have uncontrolled hypertension and hyperlipidemia. EKG in the hospital shows sinus rhythm, LVH with T-wave inversion which were present on previous EKGs as well.  Patient's mental status has improved in last few days. However, he continues to be confused especially early in the morning. CT head on 03/12/2017 showed no acute intracranial abnormality. Patient has reportedly had an EEG performed, awaiting results. He is also going to have a brain MRI performed later today.  EKG 03/14/2017: Sinus rhythm 70 bpm. Normal axis. Normal conduction. Left ventricular hypertrophy. Lateral T-wave inversions cannot exclude ischemia.  Study Conclusions  - Left ventricle: The cavity size was normal. Wall thickness was   increased in a pattern of severe LVH. There was severe concentric   hypertrophy. The estimated ejection fraction was in the range of   65% to 70%. Wall motion was normal; there were no regional wall   motion  abnormalities. Doppler parameters are consistent with   abnormal left ventricular relaxation (grade 1 diastolic   dysfunction). - Left atrium: The atrium was mildly dilated. - No significant valvular abnormality.   Inadequate TR jet to estimate PASP. Normal right atrial pressure.  Past Medical History:  Diagnosis Date  . Hypertension     History reviewed. No pertinent surgical history.  No family history of CAD  Social History:  reports that  has never smoked. he has never used smokeless tobacco. He reports that he does not drink alcohol or use drugs.  Allergies: No Known Allergies  Medications: I have reviewed the patient's current medications.  Results for orders placed or performed during the hospital encounter of 03/12/17 (from the past 48 hour(s))  Basic metabolic panel     Status: Abnormal   Collection Time: 03/13/17  5:47 PM  Result Value Ref Range   Sodium 151 (H) 135 - 145 mmol/L   Potassium 4.2 3.5 - 5.1 mmol/L   Chloride 109 101 - 111 mmol/L   CO2 32 22 - 32 mmol/L   Glucose, Bld 114 (H) 65 - 99 mg/dL   BUN 47 (H) 6 - 20 mg/dL   Creatinine, Ser 2.43 (H) 0.61 - 1.24 mg/dL   Calcium 9.0 8.9 - 10.3 mg/dL   GFR calc non Af Amer 30 (L) >60 mL/min   GFR calc Af Amer 35 (L) >60 mL/min    Comment: (NOTE) The eGFR has been calculated using the CKD EPI equation. This calculation has not been validated in all clinical situations. eGFR's persistently <60 mL/min signify possible Chronic Kidney Disease.    Anion gap 10 5 - 15  Comment: Performed at [facility], [place] [address], [place], [place] [address]  Troponin I     Status: Abnormal   Collection Time: 03/13/17  5:47 PM  Result Value Ref Range   Troponin I 0.25 (HH) <0.03 ng/mL    Comment: CRITICAL RESULT CALLED TO, READ BACK BY AND VERIFIED WITH: MCFARLAND,M. RN _0  ON 02.26.19 BY COHEN,K Performed at [facility], [place] [address] [place], [place] [address]   Magnesium      Status: None   Collection Time: 03/13/17  5:47 PM  Result Value Ref Range   Magnesium 2.1 1.7 - 2.4 mg/dL    Comment: Performed at [facility], [place] [address] [place], [place] [address]  Phosphorus     Status: None   Collection Time: 03/13/17  5:47 PM  Result Value Ref Range   Phosphorus 2.7 2.5 - 4.6 mg/dL    Comment: Performed at [facility], [place] [address], [place], [place] [address]  Basic metabolic panel     Status: Abnormal   Collection Time: 03/13/17 11:40 PM  Result Value Ref Range   Sodium 149 (H) 135 - 145 mmol/L   Potassium 4.3 3.5 - 5.1 mmol/L   Chloride 109 101 - 111 mmol/L   CO2 31 22 - 32 mmol/L   Glucose, Bld 116 (H) 65 - 99 mg/dL   BUN 40 (H) 6 - 20 mg/dL   Creatinine, Ser 2.25 (H) 0.61 - 1.24 mg/dL   Calcium 9.1 8.9 - 10.3 mg/dL   GFR calc non Af Amer 33 (L) >60 mL/min   GFR calc Af Amer 38 (L) >60 mL/min    Comment: (NOTE) The eGFR has been calculated using the CKD EPI equation. This calculation has not been validated in all clinical situations. eGFR's persistently <60 mL/min signify possible Chronic Kidney Disease.    Anion gap 9 5 - 15    Comment: Performed at [facility], [place] [address] [place], [place] [address]  CBC with Differential/Platelet     Status: Abnormal   Collection Time: 03/14/17  2:59 AM  Result Value Ref Range   WBC 5.7 4.0 - 10.5 K/uL   RBC 3.87 (L) 4.22 - 5.81 MIL/uL   Hemoglobin 10.4 (L) 13.0 - 17.0 g/dL   HCT 32.9 (L) 39.0 - 52.0 %   MCV 85.0 78.0 - 100.0 fL   MCH 26.9 26.0 - 34.0 pg   MCHC 31.6 30.0 - 36.0 g/dL   RDW 14.6 11.5 - 15.5 %   Platelets 225 150 - 400 K/uL   Neutrophils Relative % 68 %   Neutro Abs 3.9 1.7 - 7.7 K/uL   Lymphocytes Relative 20 %   Lymphs Abs 1.1 0.7 - 4.0 K/uL   Monocytes Relative 12 %   Monocytes Absolute 0.7 0.1 - 1.0 K/uL   Eosinophils Relative 0 %   Eosinophils Absolute 0.0 0.0 - 0.7 K/uL   Basophils Relative 0 %   Basophils Absolute  0.0 0.0 - 0.1 K/uL    Comment: Performed at [facility], [place] [address] [place], [place] [address]  Magnesium     Status: None   Collection Time: 03/14/17  2:59 AM  Result Value Ref Range   Magnesium 1.9 1.7 - 2.4 mg/dL    Comment: Performed at [facility], [place] [address], [place], [place] [address]  Phosphorus     Status: None   Collection Time: 03/14/17  2:59 AM  Result Value Ref Range   Phosphorus 2.5 2.5 - 4.6 mg/dL    Comment: Performed at [facility], [place] [address], [place], [place] [address]  Hepatic function panel     Status: Abnormal   Collection Time: 03/14/17  2:59 AM  Result Value Ref Range   Total Protein 7.3 6.5 - 8.1 g/dL   Albumin 4.0 3.5 - 5.0 g/dL   AST 98 (H) 15 - 41 U/L   ALT 102 (H) 17 - 63 U/L   Alkaline Phosphatase 59 38 - 126 U/L   Total Bilirubin 1.1 0.3 - 1.2 mg/dL   Bilirubin, Direct 0.2 0.1 - 0.5 mg/dL   Indirect Bilirubin 0.9 0.3 - 0.9 mg/dL    Comment: Performed at [facility], [place] [address] [place], [place] [address]  Troponin I     Status: Abnormal   Collection Time: 03/14/17  2:59 AM  Result Value Ref Range   Troponin I 2.64 (HH) <0.03 ng/mL    Comment: DELTA CHECK NOTED CRITICAL RESULT CALLED TO, READ BACK BY AND VERIFIED WITH: MCFARLAND,M RN 2.27.19 _0  ZANDOC Performed at [facility], [place] [address] [place], [place] [address]   Troponin I     Status: Abnormal   Collection Time: 03/14/17  7:13 AM  Result Value Ref Range   Troponin I 2.64 (HH) <0.03 ng/mL    Comment: CRITICAL VALUE NOTED.  VALUE IS CONSISTENT WITH PREVIOUSLY REPORTED AND CALLED VALUE. Performed at [facility], [place] [address], [place], [place] [address]   Basic metabolic panel     Status: Abnormal   Collection Time: 03/15/17  4:42 AM  Result Value Ref Range   Sodium 145 135 - 145 mmol/L   Potassium 4.5 3.5 - 5.1 mmol/L   Chloride 106 101 - 111  mmol/L   CO2 27 22 - 32 mmol/L   Glucose, Bld 101 (H) 65 - 99 mg/dL   BUN 25 (H) 6 - 20 mg/dL   Creatinine, Ser 1.71 (H) 0.61 - 1.24 mg/dL   Calcium 9.5 8.9 - 10.3 mg/dL   GFR calc non Af Amer 46 (L) >60 mL/min   GFR calc Af Amer 54 (L) >60 mL/min    Comment: (NOTE) The eGFR has been calculated using the CKD EPI equation. This calculation has not been validated in all clinical situations. eGFR's persistently <60 mL/min signify possible Chronic Kidney Disease.    Anion gap 12 5 - 15    Comment: Performed at [facility], [place] [address] [place], [place] [address]  CBC with Differential/Platelet     Status: Abnormal   Collection Time: 03/15/17  4:42 AM  Result Value Ref Range   WBC 5.7 4.0 - 10.5 K/uL   RBC 4.34 4.22 - 5.81 MIL/uL   Hemoglobin 11.7 (L) 13.0 - 17.0 g/dL   HCT 36.6 (L) 39.0 - 52.0 %   MCV 84.3 78.0 - 100.0 fL   MCH 27.0 26.0 - 34.0 pg   MCHC 32.0 30.0 - 36.0 g/dL   RDW 14.4 11.5 - 15.5 %   Platelets 279 150 - 400 K/uL   Neutrophils Relative % 60 %   Neutro Abs 3.5 1.7 - 7.7 K/uL   Lymphocytes Relative 29 %   Lymphs Abs 1.6 0.7 - 4.0 K/uL   Monocytes Relative 9 %   Monocytes Absolute 0.5 0.1 - 1.0 K/uL   Eosinophils Relative 2 %   Eosinophils Absolute 0.1 0.0 - 0.7 K/uL  Basophils Relative 0 %   Basophils Absolute 0.0 0.0 - 0.1 K/uL    Comment: Performed at [facility], [address] [place], [place] [address]  Magnesium     Status: None   Collection Time: 03/15/17  4:42 AM  Result Value Ref Range   Magnesium 2.1 1.7 - 2.4 mg/dL    Comment: Performed at [facility], [address] [place], [place] [address]  Phosphorus     Status: None   Collection Time: 03/15/17  4:42 AM  Result Value Ref Range   Phosphorus 3.3 2.5 - 4.6 mg/dL    Comment: Performed at [facility], [address] [place], [place] [address]  Troponin I (q 6hr x 3)     Status: Abnormal   Collection Time:  03/15/17  7:56 AM  Result Value Ref Range   Troponin I 2.77 (HH) <0.03 ng/mL    Comment: CRITICAL VALUE NOTED.  VALUE IS CONSISTENT WITH PREVIOUSLY REPORTED AND CALLED VALUE. Performed at [facility], [address] [place], [place] [address]   CK total and CKMB (cardiac)not at [facility]     Status: None   Collection Time: 03/15/17  7:56 AM  Result Value Ref Range   Total CK 197 49 - 397 U/L   CK, MB 3.3 0.5 - 5.0 ng/mL   Relative Index 1.7 0.0 - 2.5    Comment: Performed at [facility], [address] [place], [place] [address]  Vitamin B12     Status: None   Collection Time: 03/15/17 10:12 AM  Result Value Ref Range   Vitamin B-12 838 180 - 914 pg/mL    Comment: (NOTE) This assay is not validated for testing neonatal or myeloproliferative syndrome specimens for Vitamin B12 levels. Performed at [facility], [address] [place], [place] [address]   TSH     Status: None   Collection Time: 03/15/17 10:12 AM  Result Value Ref Range   TSH 1.747 0.350 - 4.500 uIU/mL    Comment: Performed by a 3rd Generation assay with a functional sensitivity of <=0.01 uIU/mL. Performed at [facility], [address] [place], [place] [address]   T4, free     Status: None   Collection Time: 03/15/17 10:12 AM  Result Value Ref Range   Free T4 1.05 0.61 - 1.12 ng/dL    Comment: (NOTE) Biotin ingestion may interfere with free T4 tests. If the results are inconsistent with the TSH level, previous test results, or the clinical presentation, then consider biotin interference. If needed, order repeat testing after stopping biotin. Performed at [facility], [address] [place], [place] [address]   Ammonia     Status: None   Collection Time: 03/15/17 10:12 AM  Result Value Ref Range   Ammonia 28 9 - 35 umol/L    Comment: Performed at [facility], [address] [place], [place] [address]  Troponin I (q 6hr x 3)      Status: Abnormal   Collection Time: 03/15/17  1:38 PM  Result Value Ref Range   Troponin I 2.58 (HH) <0.03 ng/mL    Comment: CRITICAL VALUE NOTED.  VALUE IS CONSISTENT WITH PREVIOUSLY REPORTED AND CALLED VALUE. Performed at [facility], [address] [place],  [address]     No results found.  Review of Systems  Constitutional: Positive for malaise/fatigue (Improved).  HENT: Negative.   Eyes:   Negative.   Respiratory: Negative for cough and shortness of breath.   Cardiovascular: Negative for chest pain, palpitations, leg swelling and PND.  Gastrointestinal: Positive for abdominal pain (Resolved). Negative for blood in stool, nausea and vomiting.  Genitourinary: Negative.   Musculoskeletal: Negative.   Skin: Negative for rash.  Neurological:       Confusion, improving  Endo/Heme/Allergies: Does not bruise/bleed easily.  Psychiatric/Behavioral: The patient is not nervous/anxious.    Blood pressure (!) 154/88, pulse 66, temperature 98.5 F (36.9 C), temperature source Oral, resp. rate 16, height 6' (1.829 m), weight 117.3 kg (258 lb 9.6 oz), SpO2 98 %. Physical Exam  Nursing note and vitals reviewed. Constitutional: He appears well-developed and well-nourished.  Moderately obese  HENT:  Head: Normocephalic and atraumatic.  Eyes: Conjunctivae are normal. Pupils are equal, round, and reactive to light.  Neck: Normal range of motion. Neck supple. No JVD present.  Cardiovascular: Normal rate, regular rhythm, normal heart sounds and intact distal pulses. Exam reveals no gallop and no friction rub.  No murmur heard. No carotid bruit. Normal peripheral pulses.  Respiratory: Effort normal and breath sounds normal. He has no wheezes. He has no rales. He exhibits no tenderness.  GI: Soft. Bowel sounds are normal. He exhibits no distension. There is no tenderness.  Musculoskeletal: Normal range of motion. He exhibits no edema.  Neurological: He is alert. No  cranial nerve deficit.  Oriented to self and place. When asked about year, says "its 1920".  Skin: Skin is warm and dry.    Assessment: 47 year old African-American male  Troponin elevation and ventricular tachycardia: White supply demand mismatch as possible given patient's profound illness with flu, metabolic encephalopathy, and acute kidney injury; his ventricular tachycardia and typical rise and fall of troponins is concerning for non-STEMI. His risk factors are uncontrolled hypertension and hyperlipidemia.  Encephalopathy: Embolic versus ?PRES Uncontrolled hypertension Hyperlipidemia Acute kidney injury: Resolved Flu illness Anemia: No signs of GI bleeding. Improving.  Recommendations: Recommend continuing beta blocker, with increased dose of bisoprolol 5 mg as you're doing. Could increase bisoprolol up to 10 mg as tolerated. If MRI shows no bleeding, start on IV heparin. Continue aspirin, currently on 325 mg. I will plan on performing coronary angiogram and possible intervention as necessary, tomorrow 03/16/2017 afternoon.  Continue Lipitor 40 mg daily. Management of hypertension, as you're doing. IV hydration overnight in preparation for cardiac cath.   Porchea Charrier J Virgie Kunda 03/15/2017, 5:20 PM    Yocheved Depner Esther Hardy, MD [facility] Pager: [phone] Office: [phone] If no answer Cell [phone]

## 2017-03-15 NOTE — Progress Notes (Signed)
Offsite EEG completed at [facility]. Results pending. 

## 2017-03-16 ENCOUNTER — Inpatient Hospital Stay (HOSPITAL_COMMUNITY): Payer: 59

## 2017-03-16 ENCOUNTER — Encounter (HOSPITAL_COMMUNITY)
Admission: EM | Disposition: A | Discharge status: Home Health | Payer: Self-pay | Source: Home / Self Care | Attending: Internal Medicine

## 2017-03-16 DIAGNOSIS — I6389 Other cerebral infarction: Secondary | ICD-10-CM

## 2017-03-16 LAB — PROTIME-INR
INR: 0.95
Prothrombin Time: 12.5 seconds (ref 11.4–15.2)

## 2017-03-16 LAB — CBC WITH DIFFERENTIAL/PLATELET
Basophils Absolute: 0 10*3/uL (ref 0.0–0.1)
Basophils Relative: 0 %
EOS ABS: 0.2 10*3/uL (ref 0.0–0.7)
EOS PCT: 3 %
HCT: 32.8 % — ABNORMAL LOW (ref 39.0–52.0)
HEMOGLOBIN: 10.6 g/dL — AB (ref 13.0–17.0)
LYMPHS ABS: 1.7 10*3/uL (ref 0.7–4.0)
Lymphocytes Relative: 25 %
MCH: 26.8 pg (ref 26.0–34.0)
MCHC: 32.3 g/dL (ref 30.0–36.0)
MCV: 83 fL (ref 78.0–100.0)
MONOS PCT: 9 %
Monocytes Absolute: 0.6 10*3/uL (ref 0.1–1.0)
Neutro Abs: 4.4 10*3/uL (ref 1.7–7.7)
Neutrophils Relative %: 63 %
PLATELETS: 315 10*3/uL (ref 150–400)
RBC: 3.95 MIL/uL — ABNORMAL LOW (ref 4.22–5.81)
RDW: 13.9 % (ref 11.5–15.5)
WBC: 6.9 10*3/uL (ref 4.0–10.5)

## 2017-03-16 LAB — COMPREHENSIVE METABOLIC PANEL
ALBUMIN: 3.9 g/dL (ref 3.5–5.0)
ALK PHOS: 69 U/L (ref 38–126)
ALT: 83 U/L — ABNORMAL HIGH (ref 17–63)
ANION GAP: 11 (ref 5–15)
AST: 47 U/L — ABNORMAL HIGH (ref 15–41)
BUN: 25 mg/dL — ABNORMAL HIGH (ref 6–20)
CO2: 26 mmol/L (ref 22–32)
Calcium: 9.5 mg/dL (ref 8.9–10.3)
Chloride: 106 mmol/L (ref 101–111)
Creatinine, Ser: 1.67 mg/dL — ABNORMAL HIGH (ref 0.61–1.24)
GFR calc Af Amer: 55 mL/min — ABNORMAL LOW (ref >60)
GFR calc non Af Amer: 48 mL/min — ABNORMAL LOW (ref >60)
GLUCOSE: 101 mg/dL — AB (ref 65–99)
POTASSIUM: 4 mmol/L (ref 3.5–5.1)
SODIUM: 143 mmol/L (ref 135–145)
Total Bilirubin: 1 mg/dL (ref 0.3–1.2)
Total Protein: 7 g/dL (ref 6.5–8.1)

## 2017-03-16 LAB — PHOSPHORUS: PHOSPHORUS: 3.5 mg/dL (ref 2.5–4.6)

## 2017-03-16 LAB — MAGNESIUM: Magnesium: 1.8 mg/dL (ref 1.7–2.4)

## 2017-03-16 SURGERY — LEFT HEART CATH AND CORONARY ANGIOGRAPHY
Anesthesia: LOCAL

## 2017-03-16 MED ORDER — ASPIRIN 81 MG PO CHEW
81.0000 mg | CHEWABLE_TABLET | ORAL | Status: AC
  Administered 2017-03-16: 81 mg via ORAL
  Filled 2017-03-16: qty 1

## 2017-03-16 MED ORDER — SODIUM CHLORIDE 0.9% FLUSH
3.0000 mL | Freq: Two times a day (BID) | INTRAVENOUS | Status: DC
  Administered 2017-03-16 – 2017-03-19 (×6): 3 mL via INTRAVENOUS

## 2017-03-16 MED ORDER — STROKE: EARLY STAGES OF RECOVERY BOOK: Freq: Once | Status: DC

## 2017-03-16 MED ORDER — SODIUM CHLORIDE 0.9% FLUSH: 3.0000 mL | INTRAVENOUS | Status: DC | PRN

## 2017-03-16 MED ORDER — ASPIRIN 81 MG PO CHEW: 81.0000 mg | CHEWABLE_TABLET | ORAL | Status: DC

## 2017-03-16 MED ORDER — SODIUM CHLORIDE 0.9 % IV SOLN: 250.0000 mL | INTRAVENOUS | Status: DC | PRN

## 2017-03-16 NOTE — Evaluation (Signed)
Physical Therapy Evaluation Patient Details Name: Anthony Mcdaniel MRN: [id number] DOB: July 10, 1970 Today's Date: 03/16/2017   History of Present Illness  Pt with PMH of uncontrolled hypertension; admitted on 03/12/2017, presented with complaint of confusion, was found to have acute encephalopathy, acute kidney injury as well as metabolic acidosis, recent flu with generalized weakness priro to admission; MRI= old basal ganglia infarct  Clinical Impression  Pt admitted with above diagnosis. Pt currently with functional limitations due to the deficits listed below (see PT Problem List). Pt is independent at his baseline, he is sleepy on PT arrival but arouses with multiple stimuli, does not verbalize a great deal but what  he says is appropriate; pt able to amb 64' with HHA and min guard; doubt pt will need f/u PT as long as mental status continues to clear; recommend pt be OOB as much as possible as he has not been OOB since admission;   Pt will benefit from skilled PT to increase their independence and safety with mobility to allow discharge to the venue listed below.       Follow Up Recommendations No PT follow up;Supervision for mobility/OOB    Equipment Recommendations  None recommended by PT    Recommendations for Other Services       Precautions / Restrictions Precautions Precautions: Fall Restrictions Weight Bearing Restrictions: No      Mobility  Bed Mobility Overal bed mobility: Needs Assistance Bed Mobility: Supine to Sit     Supine to sit: Min guard;Supervision     General bed mobility comments: for safety  Transfers Overall transfer level: Needs assistance Equipment used: 1 person hand held assist;2 person hand held assist Transfers: Sit to/from Stand Sit to Stand: Min assist         General transfer comment: to rise and steady  Ambulation/Gait Ambulation/Gait assistance: Min guard;Min assist Ambulation Distance (Feet): 60 Feet Assistive device: 1 person hand  held assist Gait Pattern/deviations: Step-through pattern;Wide base of support     General Gait Details: stability improved with distance, pt with mild initial instability however no overt LOB   Stairs            Wheelchair Mobility    Modified Rankin (Stroke Patients Only)       Balance Overall balance assessment: Needs assistance Sitting-balance support: No upper extremity supported;Feet supported Sitting balance-Leahy Scale: Good       Standing balance-Leahy Scale: Fair                               Pertinent Vitals/Pain Pain Assessment: No/denies pain    Home Living Family/patient expects to be discharged to:: Private residence Living Arrangements: Spouse/significant other   Type of Home: House Home Access: Stairs to enter   [employer] of Steps: pt does not recall/unsure Home Layout: Two level Home Equipment: None      Prior Function Level of Independence: Independent         Comments: pt works as Scientist, research (life sciences)   Dominant Hand: Left    Extremity/Trunk Assessment   Upper Extremity Assessment Upper Extremity Assessment: RUE deficits/detail;LUE deficits/detail RUE Deficits / Details: AROM grossly WFL; strength 4 to 5/5 RUE Coordination: decreased fine motor LUE Deficits / Details: AROM grossly WFL; strength 4 to 5/5    Lower Extremity Assessment Lower Extremity Assessment: Overall WFL for tasks assessed       Communication   Communication: Other (comment)(difficult  to understand at times, low volume)  Cognition Arousal/Alertness: Awake/alert(sleepy but arouses with multiple stimuli) Behavior During Therapy: [facility] for tasks assessed/performed;Flat affect Overall Cognitive Status: Impaired/Different from baseline Area of Impairment: Orientation;Following commands;Problem solving                 Orientation Level: Disoriented to;Time     Following Commands: Follows one step commands  with increased time;Follows multi-step commands with increased time     Problem Solving: Slow processing;Decreased initiation;Requires verbal cues;Requires tactile cues General Comments: pt with limited verbalization,  very sleepy initially      General Comments      Exercises     Assessment/Plan    PT Assessment Patient needs continued PT services  PT Problem List Decreased balance;Decreased cognition;Decreased mobility;Decreased coordination       PT Treatment Interventions Therapeutic activities;Gait training;Functional mobility training;Therapeutic exercise;Patient/family education;Balance training    PT Goals (Current goals can be found in the Care Plan section)  Acute Rehab PT Goals Patient Stated Goal: none stated PT Goal Formulation: With patient Time For Goal Achievement: 03/30/17 Potential to Achieve Goals: Good    Frequency Min 3X/week   Barriers to discharge        Co-evaluation               AM-PAC PT "6 Clicks" Daily Activity  Outcome Measure Difficulty turning over in bed (including adjusting bedclothes, sheets and blankets)?: A Little Difficulty moving from lying on back to sitting on the side of the bed? : A Little Difficulty sitting down on and standing up from a chair with arms (e.g., wheelchair, bedside commode, etc,.)?: A Little Help needed moving to and from a bed to chair (including a wheelchair)?: A Little Help needed walking in hospital room?: A Little Help needed climbing 3-5 steps with a railing? : A Lot 6 Click Score: 17    End of Session Equipment Utilized During Treatment: Gait belt Activity Tolerance: Patient tolerated treatment well Patient left: in chair;with chair alarm set;with call bell/phone within reach Nurse Communication: Mobility status PT Visit Diagnosis: Other abnormalities of gait and mobility (R26.89)    Time: 1030-1048 PT Time Calculation (min) (ACUTE ONLY): 18 min   Charges:   PT Evaluation $PT Eval Low  Complexity: 1 Low     PT G CodesDrucilla Chalet:        Shawndrea Rutkowski, PT Pager: [phone] 03/16/2017   [facility] Surgery CenterWILLIAMS,Morgan Keinath 03/16/2017, 11:05 AM

## 2017-03-16 NOTE — Progress Notes (Signed)
[facility] Progress Note  Patient: Anthony Mcdaniel BJY:[id number]   PCP: Mila Palmer, MD DOB: 1971/01/13   DOA: 03/12/2017   DOS: 03/16/2017   Date of Service: the patient was seen and examined on 03/16/2017  Subjective: Feeling better, sleepy but denies any acute complaint.  No chest pain abdominal pain.  No acute events overnight.  Brief hospital course: Pt. with PMH of uncontrolled hypertension; admitted on 03/12/2017, presented with complaint of confusion, was found to have acute encephalopathy, acute kidney injury as well as metabolic acidosis. Currently further plan is further workup of encephalopathy and follow cardiology recommendation.  Assessment and Plan: 1.  Acute kidney injury. Hyponatremia Likely from dehydration from influenza. Also possibility of severe hypotension causing ischemic injury. Patient was hydrated aggressively with significant improvement in renal function. Presented with serum creatinine of 6.5, baseline creatinine 1.48, Current creatinine almost close to baseline. CTA renal protocol negative for hydronephrosis. Continue IV fluids. Avoid nephrotoxic medication.  2.  Accelerated hypertension. Hypotension with hypovolemic shock. Presented with severe dehydration required aggressive hydration as well as IV phenylephrine drip. Now off the pressor and the blood pressure is elevated. Patient appears to have uncontrolled hypertension at his baseline and is on multiple antihypertensive medications. At present will gradually resume his bisoprolol as well as imdur.  3.  Acute encephalopathy. Possibly metabolic in nature from presentation with severe hypotension. CT scan of the head unremarkable.  Metabolic workup also unremarkable.  Do not appear to have any active infection. EEG negative for any focus of seizures. MRI brain shows multiple numerous small acute infarct, possibly watershed but cardioembolic phenomenon cannot be ruled out. Discussed with  neurology and recommended transfer to [facility] for further workup. MRA brain ordered, carotid Doppler. Patient will get a TEE on Monday. PT OT recommends no therapy although patient is seen t staggering while ambulating.  Recommend evaluation.  4.  Elevated troponin. Likely demand ischemia from hypotension followed by hypertension. Also poor clearance from acute on chronic kidney disease. Echocardiogram was performed, preserved EF without any wall motion abnormalities.  Patient denies having any chest pain. EKG unremarkable. Having multiple PVCs on telemetry. Appreciate cardiology input.  Initial plan was to perform a cardiac catheterization due to prolonged PVC episode but now that the patient has findings of acute stroke patient will get stroke workup and will need neurology clearance before going for cardiac cath. TEE scheduled on Monday.  5.  Anemia of chronic kidney disease. Monitor H&H.   Diet: Renal diet DVT Prophylaxis: subcutaneous Heparin  Advance goals of care discussion: Full code  Family Communication: no family was present at bedside, at the time of interview.  Discussed with patient's wife on phone on 03/16/2017.  Disposition:  Discharge to home.  Consultants: cardiology neurology PCCM primary  Procedures: Echocardiogram   Antibiotics: Anti-infectives (From admission, onward)   None       Objective: Physical Exam: Vitals:   03/15/17 1935 03/15/17 2014 03/16/17 0423 03/16/17 1404  BP: 134/81  (!) 145/80 116/70  Pulse: 77  79 66  Resp: 16  18 18   Temp: 98.4 F (36.9 C)  99.3 F (37.4 C) 98 F (36.7 C)  TempSrc: Oral  Oral Oral  SpO2: 95%  96% 97%  Weight:  118.5 kg (261 lb 3.9 oz) 117.9 kg (259 lb 14.8 oz)   Height:        Intake/Output Summary (Last 24 hours) at 03/16/2017 1531 Last data filed at 03/16/2017 0200 Gross per 24 hour  Intake 453.75  ml  Output -  Net 453.75 ml   Filed Weights   03/14/17 1816 03/15/17 2014 03/16/17 0423    Weight: 117.3 kg (258 lb 9.6 oz) 118.5 kg (261 lb 3.9 oz) 117.9 kg (259 lb 14.8 oz)   General: Alert, Awake and Oriented to Time, Place and Person. Appear in mild distress, affect blunted Eyes: PERRL, Conjunctiva normal ENT: Oral Mucosa clear moist. Neck: no JVD, no Abnormal Mass Or lumps Cardiovascular: S1 and S2 Present, no Murmur, Peripheral Pulses Present Respiratory: normal respiratory effort, Bilateral Air entry equal and Decreased, no use of accessory muscle, Clear to Auscultation, no Crackles, no wheezes Abdomen: Bowel Sound present, Soft and no tenderness, no hernia Skin: no redness, no Rash, no induration Extremities: no Pedal edema, no calf tenderness Neurologic: Grossly no focal neuro deficit. Bilaterally Equal motor strength Mental status AAOx2, speech normal, attention poor,  Cerebellar test normal finger nose finger. Gait not checked due to patient safety concerns.   Data Reviewed: CBC: Recent Labs  Lab 03/12/17 1053 03/13/17 0030 03/14/17 0259 03/15/17 0442 03/16/17 0453  WBC 6.9 6.9 5.7 5.7 6.9  NEUTROABS 5.3  --  3.9 3.5 4.4  HGB 10.4* 10.4* 10.4* 11.7* 10.6*  HCT 30.9* 31.1* 32.9* 36.6* 32.8*  MCV 80.9 81.2 85.0 84.3 83.0  PLT 220 223 225 279 315   Basic Metabolic Panel: Recent Labs  Lab 03/13/17 0030 03/13/17 0748 03/13/17 1747 03/13/17 2340 03/14/17 0259 03/15/17 0442 03/16/17 0453  NA 145 148* 151* 149*  --  145 143  K 3.8 3.9 4.2 4.3  --  4.5 4.0  CL 109 107 109 109  --  106 106  CO2 24 28 32 31  --  27 26  GLUCOSE 129* 128* 114* 116*  --  101* 101*  BUN 77* 63* 47* 40*  --  25* 25*  CREATININE 3.92* 2.89* 2.43* 2.25*  --  1.71* 1.67*  CALCIUM 8.3* 8.4* 9.0 9.1  --  9.5 9.5  MG 1.9  --  2.1  --  1.9 2.1 1.8  PHOS 3.3  --  2.7  --  2.5 3.3 3.5    Liver Function Tests: Recent Labs  Lab 03/12/17 1053 03/14/17 0259 03/16/17 0453  AST 67* 98* 47*  ALT 75* 102* 83*  ALKPHOS 46 59 69  BILITOT 0.8 1.1 1.0  PROT 6.3* 7.3 7.0  ALBUMIN  3.7 4.0 3.9   Recent Labs  Lab 03/12/17 1257  LIPASE 38   Recent Labs  Lab 03/12/17 1257 03/15/17 1012  AMMONIA 34 28   Coagulation Profile: Recent Labs  Lab 03/16/17 0453  INR 0.95   Cardiac Enzymes: Recent Labs  Lab 03/14/17 0259 03/14/17 0713 03/15/17 0756 03/15/17 1338 03/15/17 1814  CKTOTAL  --   --  197  --   --   CKMB  --   --  3.3  --   --   TROPONINI 2.64* 2.64* 2.77* 2.58* 2.25*   BNP (last 3 results) No results for input(s): PROBNP in the last 8760 hours. CBG: Recent Labs  Lab 03/12/17 1319  GLUCAP 97   Studies: Mr Shirlee Latch ZO Contrast  Result Date: 03/16/2017 CLINICAL DATA:  47 year old male with altered mental status and numerous punctate supratentorial acute infarcts on brain MRI yesterday. EXAM: MRA HEAD WITHOUT CONTRAST TECHNIQUE: Angiographic images of the Circle of Willis were obtained using MRA technique without intravenous contrast. COMPARISON:  Brain MRI 03/15/2017.  Noncontrast head CT 03/12/2017. FINDINGS: Study is mildly degraded by motion  artifact. There is no intracranial mass effect or ventriculomegaly. Antegrade flow in the posterior circulation with codominant distal vertebral arteries. No distal vertebral stenosis. Both PICA origins are patent. Patent vertebrobasilar junction. Mildly tortuous and irregular basilar artery but no convincing basilar stenosis. SCA and PCA origins are patent and within normal limits, fetal type left PCA origin. The right posterior communicating artery is present. Bilateral PCA branches are within normal limits allowing for motion. Antegrade flow in both ICA siphons. No siphon stenosis. Ophthalmic and posterior communicating artery origins appear normal. The carotid termini are patent. The left ACA A1 segment is tortuous. The anterior communicating artery and visible ACA branches are within normal limits. Bilateral MCA origins and M1 segments appear normal. Both MCA bifurcations are patent. The visible bilateral MCA  branches are within normal limits when allowing for some motion. IMPRESSION: Negative intracranial MRA. Electronically Signed   By: Odessa FlemingH  Hall M.D.   On: 03/16/2017 12:57   Mr Brain Wo Contrast  Result Date: 03/15/2017 CLINICAL DATA:  Altered level of consciousness. Diagnosed with flu 3 days ago. Suspect stroke. History of hypertension. EXAM: MRI HEAD WITHOUT CONTRAST TECHNIQUE: Multiplanar, multiecho pulse sequences of the brain and surrounding structures were obtained without intravenous contrast. COMPARISON:  CT HEAD March 12, 2017 FINDINGS: Mild motion degraded examination. INTRACRANIAL CONTENTS: Greater than 20 subcentimeter relatively symmetric supratentorial and cortical foci of reduced diffusion including LEFT basal ganglia with low ADC values on identifiable lesions. Pontine microhemorrhage. Patchy supratentorial white matter FLAIR T2 hyperintensities exclusive a aforementioned abnormality. Old LEFT basal ganglia lacunar infarct. No midline shift, mass effect or definite masses though limited by motion. No abnormal extra-axial fluid collections. No parenchymal brain volume loss for age. No hydrocephalus. VASCULAR: Normal major intracranial vascular flow voids present at skull base. SKULL AND UPPER CERVICAL SPINE: No abnormal sellar expansion. No suspicious calvarial bone marrow signal. Craniocervical junction maintained. SINUSES/ORBITS: The mastoid air-cells and included paranasal sinuses are well-aerated.The included ocular globes and orbital contents are non-suspicious. OTHER: None. IMPRESSION: 1. Motion degraded examination. 2. Numerous small supratentorial acute infarcts spanning multiple vascular territories. Differential diagnosis includes embolic phenomena, hypotensive episode, vasculopathy. 3. Mild chronic small vessel ischemic disease, advanced for age. 4. These results will be called to the ordering clinician or representative by the Radiologist Assistant, and communication documented in the  PACS or zVision Dashboard. Electronically Signed   By: Awilda Metroourtnay  Bloomer M.D.   On: 03/15/2017 19:49    Scheduled Meds: .  stroke: mapping our early stages of recovery book   Does not apply Once  . aspirin EC  325 mg Oral Daily  . atorvastatin  40 mg Oral q1800  . heparin  5,000 Units Subcutaneous Q8H  . isosorbide mononitrate  30 mg Oral Daily  . nebivolol  5 mg Oral Daily  . sodium chloride flush  3 mL Intravenous Q12H   Continuous Infusions: . sodium chloride 250 mL (03/15/17 1013)  . sodium chloride    . sodium chloride 75 mL/hr at 03/15/17 1957   PRN Meds:   Time spent: 35 minutes  Author: Lynden OxfordPranav Wilbern Pennypacker, MD [facility] Pager: [phone] 03/16/2017 3:31 PM  If 7PM-7AM, please contact night-coverage at [email or web address], password [email or web address]

## 2017-03-16 NOTE — Progress Notes (Signed)
Nutrition Follow-up  DOCUMENTATION CODES:   Obesity unspecified  INTERVENTION:   - Boost Breeze po TID, each supplement provides 250 kcal and 9 grams of protein - Encourage PO intake  NUTRITION DIAGNOSIS:   Inadequate oral intake related to inability to eat as evidenced by NPO status.  Improving, pt no longer NPO  GOAL:   Patient will meet greater than or equal to 90% of their needs  Progressing  MONITOR:   Diet advancement, Weight trends, Labs  REASON FOR ASSESSMENT:   Malnutrition Screening Tool   ASSESSMENT:   47 year old male patient who was diagnosed with influenza on 2/20 at an urgent care.  Leading up to that had to have a typical symptoms of cough, fever, weakness and fatigue.  His p.o. intake has been poor.  Per his wife, the few days after being diagnosed he remained quite weak staying primarily in bed.  He was given 4 L of NS in the ED with improvement in his blood pressure.  03/15/17 - numerous punctate supratentorial acute infarcts on brain MRI 03/16/17 - negative intracranial MRA  Spoke with pt's wife at bedside who reports [facility] RD noted 75% meal completion on 03/14/17 but no data listed since then. Per pt's wife, pt had a few sips of broth and is currently drinking apple juice from his lunch tray. Pt's wife states that when pt was on a solid food diet, he was eating more.  Pt's wife agreeable to RD ordering Boost Breeze BID for added protein while pt is on CLD.  Medications reviewed and include: 40 mg Lipitor daily  Labs reviewed: BUN 25 (H), creatinine 1.67 (H), AST 47 (H), ALT 83 (H)  Diet Order:  Diet clear liquid Room service appropriate? Yes; Fluid consistency: Thin Fall precautions  EDUCATION NEEDS:   No education needs have been identified at this time  Skin:  Skin Assessment: Reviewed RN Assessment  Last BM:  Unknown  Height:   Ht Readings from Last 1 Encounters:  03/14/17 6' (1.829 m)    Weight:    Wt Readings from Last 1 Encounters:  03/16/17 259 lb 14.8 oz (117.9 kg)    Ideal Body Weight:  80.91 kg  BMI:  Body mass index is 35.25 kg/m.  Estimated Nutritional Needs:   Kcal:  [id number] (17-19 kcal/kg)  Protein:  117-130 grams  Fluid:  >/= 2.3 L/day    Earma ReadingKate Jablonski Jaston Havens, MS, RD, LDN Pager: [phone] Weekend/After Hours: [phone]

## 2017-03-16 NOTE — Progress Notes (Signed)
MRI brain findings reviewed. Cath canceled. Hold heparin. Patient can eat if no other procedure planned. [facility] Plan for TEE on Monday morning 3/4.  Discussed w/Dr. Allena KatzPatel.  Elder NegusManish J Jaymes Hang, MD [facility] Pager: [phone] Office: [phone] If no answer Cell [phone]

## 2017-03-16 NOTE — Progress Notes (Signed)
Carotid duplex prelim: right 1-39% ICA stenosis, left 40-59% ICA stenosis. Jenaveve Fenstermaker Eunice, RDMS, RVT  

## 2017-03-16 NOTE — Plan of Care (Signed)
  Progressing Education: Knowledge of General Education information will improve 03/16/2017 2230 - Progressing by Cristela FeltSteffens, Gemini Beaumier P, RN Pain Managment: General experience of comfort will improve 03/16/2017 2230 - Progressing by Cristela FeltSteffens, Davina Howlett P, RN Safety: Ability to remain free from injury will improve 03/16/2017 2230 - Progressing by Cristela FeltSteffens, Gyselle Matthew P, RN

## 2017-03-17 DIAGNOSIS — Z8673 Personal history of transient ischemic attack (TIA), and cerebral infarction without residual deficits: Secondary | ICD-10-CM

## 2017-03-17 DIAGNOSIS — I6389 Other cerebral infarction: Secondary | ICD-10-CM

## 2017-03-17 LAB — CULTURE, BLOOD (ROUTINE X 2)
CULTURE: NO GROWTH
CULTURE: NO GROWTH
SPECIAL REQUESTS: ADEQUATE
SPECIAL REQUESTS: ADEQUATE

## 2017-03-17 MED ORDER — ATORVASTATIN CALCIUM 80 MG PO TABS
80.0000 mg | ORAL_TABLET | Freq: Every day | ORAL | Status: DC
  Administered 2017-03-17 – 2017-03-19 (×3): 80 mg via ORAL
  Filled 2017-03-17 (×3): qty 1

## 2017-03-17 NOTE — Progress Notes (Signed)
STROKE TEAM PROGRESS NOTE   HISTORY OF PRESENT ILLNESS (per record) Anthony Mcdaniel is an 47 y.o. male who presented to the hospital on 2/25 with AMS and generalized weakness. AKI, hypotension and metabolic acidosis were noted on admission.   Per ICU note: "This is a 47 year old male patient who was diagnosed with influenza on 2/20 at an urgent care. Leading up to that had to have a typical symptoms of cough, fever, weakness and fatigue. Treatment had been supportive in nature, his p.o. intake has been poor, he had been taking his oral antihypertensives and diuretics up until 2/20 however his oral intake had been poor leading up to that. Per his wife the following few days after being diagnosed he remained quite weak staying primarily in bed. On the a.m. of 2/25 he was too weak to get out of bed, he was incoherent, and mumbling only. Because of this EMS was called. In the emergency room he was found to be hypotensive with systolic blood pressure in the 70s-80s, his serum bicarbonate was 18, his BUN was 112 and creatinine 6.54. An venous blood gas was obtained showing a pH of 7.23, and serum bicarbonate 20.1. He was given 4 L of normal saline with improvement in his blood pressure, CT of abdomen pelvis was obtained this was negative for pathology specifically No evidence of regional calculi or hydronephrosis. Because of his acute encephalopathy as well as metabolic derangements and acute kidney injury critical care was asked to admit."  Initial treatment resulted in some improvement in his confusion, but not full recovery. An MRI was therefore obtained, revealing numerous small supratentorial acute infarcts spanning multiple vascular territories. Differential diagnosis includes embolic phenomena, hypotensive episode and vasculopathy. Also noted were mild chronic small vessel ischemic changes, advanced for age.  An EEG obtained on 2/28 was normal.   His PMHx includes uncontrolled HTN.       SUBJECTIVE (INTERVAL HISTORY) No family members present. The patient was initially lethargic as it appeared that he had been sleeping. As he woke up he was alert and oriented and able to follow commands. There was no focal weakness on exam. Briefly discussed need for TEE.    OBJECTIVE Temp:  [97.6 F (36.4 C)-99 F (37.2 C)] 99 F (37.2 C) (03/02 0549) Pulse Rate:  [66-78] 78 (03/02 0549) Cardiac Rhythm: Normal sinus rhythm;Heart block (03/02 0700) Resp:  [18-20] 18 (03/02 0549) BP: (116-161)/(70-78) 136/74 (03/02 0549) SpO2:  [95 %-99 %] 95 % (03/02 0549) Weight:  [259 lb 14.8 oz (117.9 kg)-268 lb 1.3 oz (121.6 kg)] 268 lb 1.3 oz (121.6 kg) (03/02 0022)  CBC:  Recent Labs  Lab 03/15/17 0442 03/16/17 0453  WBC 5.7 6.9  NEUTROABS 3.5 4.4  HGB 11.7* 10.6*  HCT 36.6* 32.8*  MCV 84.3 83.0  PLT 279 315    Basic Metabolic Panel:  Recent Labs  Lab 03/15/17 0442 03/16/17 0453  NA 145 143  K 4.5 4.0  CL 106 106  CO2 27 26  GLUCOSE 101* 101*  BUN 25* 25*  CREATININE 1.71* 1.67*  CALCIUM 9.5 9.5  MG 2.1 1.8  PHOS 3.3 3.5    Lipid Panel:     Component Value Date/Time   CHOL 251 (H) 03/19/2015 0034   TRIG 168 (H) 03/19/2015 0034   HDL 35 (L) 03/19/2015 0034   CHOLHDL 7.2 03/19/2015 0034   VLDL 34 03/19/2015 0034   LDLCALC 182 (H) 03/19/2015 0034   HgbA1c: No results found for: HGBA1C Urine Drug Screen:  Component Value Date/Time   LABOPIA NONE DETECTED 03/12/2017 1337   COCAINSCRNUR NONE DETECTED 03/12/2017 1337   LABBENZ NONE DETECTED 03/12/2017 1337   AMPHETMU NONE DETECTED 03/12/2017 1337   THCU NONE DETECTED 03/12/2017 1337   LABBARB NONE DETECTED 03/12/2017 1337    Alcohol Level No results found for: [facility]  IMAGING  Mr Maxine Glenn Head Wo Contrast 03/16/2017 IMPRESSION:  Negative intracranial MRA.    Mr Brain Wo Contrast 03/15/2017 IMPRESSION:  1. Motion degraded examination.  2. Numerous small supratentorial acute infarcts spanning multiple  vascular territories. Differential diagnosis includes embolic phenomena, hypotensive episode, vasculopathy.  3. Mild chronic small vessel ischemic disease, advanced for age.     Transthoracic Echocardiogram  03/13/2017 Study Conclusions - Left ventricle: The cavity size was normal. Wall thickness was   increased in a pattern of severe LVH. There was severe concentric   hypertrophy. The estimated ejection fraction was in the range of   65% to 70%. Wall motion was normal; there were no regional wall   motion abnormalities. Doppler parameters are consistent with   abnormal left ventricular relaxation (grade 1 diastolic   dysfunction). - Left atrium: The atrium was mildly dilated. - No significant valvular abnormality.   Inadequate TR jet to estimate PASP. Normal right atrial pressure.   TEE - pending 03/16/2017   EEG  03/15/2017 Impression:   This is a normal EEG in the awake and brief sleep states.     Bilateral Carotid Dopplers 03/16/2017 Final Interpretation: Right Carotid: Velocities in the right ICA are consistent with a 1-39% stenosis. Left Carotid: Velocities in the left ICA are consistent with a 40-59% stenosis. Vertebrals: Both vertebral arteries were patent with antegrade flow.  Vitals:   03/17/17 0022 03/17/17 0549 03/17/17 0836 03/17/17 1347  BP:  136/74 138/81 123/78  Pulse:  78 70 69  Resp:  18 20 20   Temp:  99 F (37.2 C) 98.9 F (37.2 C) 98.7 F (37.1 C)  TempSrc:  Oral Oral Oral  SpO2:  95% 97% 96%  Weight: 268 lb 1.3 oz (121.6 kg)     Height: 6\' 1"  (1.854 m)      Pleasant middle-age male currently not in distress. . Afebrile. Head is nontraumatic. Neck is supple without bruit.    Cardiac exam no murmur or gallop. Lungs are clear to auscultation. Distal pulses are well felt. Neurological Exam ;  Awake  Alert oriented x 3. Normal speech and language.eye movements full without nystagmus.fundi were not visualized. Vision acuity and fields appear normal.  Hearing is normal. Palatal movements are normal. Face symmetric. Tongue midline. Normal strength, tone, reflexes and coordination. Normal sensation. Gait deferred. ASSESSMENT/PLAN Mr. Anthony Mcdaniel is a 48 y.o. male with history of hypertension and hyperlipidemia presenting with altered mental status, generalized weakness, acute kidney injury, hypotension, and metabolic acidosis. He did not receive IV t-PA due to late presentation.  Stroke: bilateral small infarcts embolic etiology - source unknown vs hypotension.  Resultant  no focal deficits  CT head - No acute intracranial pathology.  MRI head -  Numerous small supratentorial acute infarcts spanning multiple vascular territories.  MRA head - negative  EEG - normal  Carotid Doppler - left ICA - 40-59% stenosis.  2D Echo - EF 65-70%. Mildly dilated atrium but no cardiac source of emboli identified.  LDL - 182  HgbA1c - will order  VTE prophylaxis - subcutaneous heparin Diet clear liquid Room service appropriate? Yes; Fluid consistency: Thin Fall precautions  aspirin 81 mg daily  prior to admission, now on aspirin 325 mg daily  Patient counseled to be compliant with his antithrombotic medications  Ongoing aggressive stroke risk factor management  Therapy recommendations:  pending  Disposition:  Pending  Hypertension  Stable  Permissive hypertension (OK if < 220/120) but gradually normalize in 5-7 days  Long-term BP goal normotensive  Hyperlipidemia  Home meds:  Lipitor 40 mg daily resumed in hospital  LDL 182, goal < 70  Will increase Lipitor to 80 mg daily  Continue statin at discharge    Other Stroke Risk Factors  Obesity, Body mass index is 35.37 kg/m., recommend weight loss, diet and exercise as appropriate    Other Active Problems  Mild anemia  Acute kidney injury   Plan / Recommendations   Possible TEE / loop.   Hospital day # 5  Delton SeeDavid Rinehuls PA-C [facility] Pager  [phone] 03/17/2017, 3:18 PM I have personally examined this patient, reviewed notes, independently viewed imaging studies, participated in medical decision making and plan of care.ROS completed by me personally and pertinent positives fully documented  I have made any additions or clarifications directly to the above note. Agree with note above.  He presented with confusion and encephalopathy in the setting of hypertension but MRI scan shows multiple punctate infarcts in different vascular distributions raising strong suspicion for cardiac embolism. Versus multifocal small vessel disease. Recommend checking carotid ultrasound, transesophageal echocardiogram and prolonged cardiac monitoring. Greater than 50% time during this 25 minute visit was spent on counseling and coordination of care about his strokes and answering questions  Delia HeadyPramod Sethi, MD Medical Director [facility] Pager: [phone] 03/17/2017 3:35 PM   To contact Stroke Continuity provider, please refer to [email or web address]. After hours, contact General Neurology

## 2017-03-17 NOTE — Consult Note (Signed)
Referring Physician: Dr. Wendee Beavers    Chief Complaint: New strokes seen on MRI  HPI: Anthony Mcdaniel is an 47 y.o. male who presented to the hospital on 2/25 with AMS and generalized weakness. AKI, hypotension and metabolic acidosis were noted on admission.   Per ICU note: "This is a 47 year old male patient who was diagnosed with influenza on 2/20 at an urgent care.  Leading up to that had to have a typical symptoms of cough, fever, weakness and fatigue.  Treatment had been supportive in nature, his p.o. intake has been poor, he had been taking his oral antihypertensives and diuretics up until 2/20 however his oral intake had been poor leading up to that.  Per his wife the following few days after being diagnosed he remained quite weak staying primarily in bed.  On the a.m. of 2/25 he was too weak to get out of bed, he was incoherent, and mumbling only.  Because of this EMS was called.  In the emergency room he was found to be hypotensive with systolic blood pressure in the 70s-80s, his serum bicarbonate was 18, his BUN was 112 and creatinine 6.54.  An venous blood gas was obtained showing a pH of 7.23, and serum bicarbonate 20.1.  He was given 4 L of normal saline with improvement in his blood pressure, CT of abdomen pelvis was obtained this was negative for pathology specifically No evidence of regional calculi or hydronephrosis.  Because of his acute encephalopathy as well as metabolic derangements and acute kidney injury critical care was asked to admit."  Initial treatment resulted in some improvement in his confusion, but not full recovery. An MRI was therefore obtained, revealing numerous small supratentorial acute infarcts spanning multiple vascular territories. Differential diagnosis includes embolic phenomena, hypotensive episode and vasculopathy. Also noted were mild chronic small vessel ischemic changes, advanced for age.  An EEG obtained on 2/28 was normal.   His PMHx includes uncontrolled HTN.     Past Medical History:  Diagnosis Date  . Hypertension     History reviewed. No pertinent surgical history.  No family history on file. Social History:  reports that  has never smoked. he has never used smokeless tobacco. He reports that he does not drink alcohol or use drugs.  Allergies: No Known Allergies  Medications:  Prior to Admission:  Medications Prior to Admission  Medication Sig Dispense Refill Last Dose  . amLODipine (NORVASC) 10 MG tablet Take 1 tablet (10 mg total) by mouth daily. 30 tablet 3 Past Week at Unknown time  . aspirin EC 81 MG tablet Take 81 mg by mouth daily.   Past Week at Unknown time  . atorvastatin (LIPITOR) 40 MG tablet Take 1 tablet (40 mg total) by mouth daily at 6 PM. 30 tablet 3 Past Week at Unknown time  . BYSTOLIC 20 MG TABS Take 20 mg by mouth daily.  5 Past Week at Unknown time  . Chlorphen-Pseudoephed-APAP (THERAFLU FLU/COLD PO) Take 15 mLs by mouth 2 (two) times daily as needed (COUGH/FLU).   Past Week at Unknown time  . chlorthalidone (HYGROTON) 25 MG tablet Take 1 tablet (25 mg total) by mouth daily. 30 tablet 3 Past Week at Unknown time  . diltiazem (CARDIZEM CD) 360 MG 24 hr capsule Take 360 mg by mouth daily.  5 Past Week at Unknown time  . fluticasone (FLONASE) 50 MCG/ACT nasal spray Place into both nostrils daily.   03/12/2017 at Unknown time  . hydrALAZINE (APRESOLINE) 50 MG tablet Take 50  mg by mouth 3 (three) times daily.  2 Past Week at Unknown time  . hydrochlorothiazide (HYDRODIURIL) 25 MG tablet Take 25 mg by mouth daily.   Past Week at Unknown time  . isosorbide mononitrate (IMDUR) 60 MG 24 hr tablet Take 60 mg by mouth daily.   Past Week at Unknown time  . losartan (COZAAR) 100 MG tablet Take 100 mg by mouth daily.  2 Past Week at Unknown time  . minoxidil (LONITEN) 10 MG tablet Take 10 mg by mouth daily.  5 Past Week at Unknown time  . promethazine-dextromethorphan (PROMETHAZINE-DM) 6.25-15 MG/5ML syrup Take 5 mLs by mouth  every 6 (six) hours as needed for cough.   Past Week at Unknown time  . isosorbide mononitrate (IMDUR) 120 MG 24 hr tablet Take 1 tablet (120 mg total) by mouth daily. (Patient not taking: Reported on 03/12/2017) 30 tablet 3 Not Taking at Unknown time  . metoprolol (LOPRESSOR) 100 MG tablet Take 1 tablet (100 mg total) by mouth 2 (two) times daily. (Patient not taking: Reported on 03/12/2017) 30 tablet 3 Not Taking at Unknown time  . spironolactone (ALDACTONE) 50 MG tablet Take 1 tablet (50 mg total) by mouth daily. (Patient not taking: Reported on 03/12/2017) 30 tablet 3 Not Taking at Unknown time   Scheduled: .  stroke: mapping our early stages of recovery book   Does not apply Once  . aspirin EC  325 mg Oral Daily  . atorvastatin  40 mg Oral q1800  . heparin  5,000 Units Subcutaneous Q8H  . isosorbide mononitrate  30 mg Oral Daily  . nebivolol  5 mg Oral Daily  . sodium chloride flush  3 mL Intravenous Q12H   Continuous: . sodium chloride 1,000 mL (03/16/17 2032)  . sodium chloride      ROS: As per HPI.   Physical Examination: Blood pressure 136/74, pulse 78, temperature 99 F (37.2 C), temperature source Oral, resp. rate 18, height '6\' 1"'  (1.854 m), weight 121.6 kg (268 lb 1.3 oz), SpO2 95 %.  HEENT: [place]/AT Lungs: Respirations unlabored Ext: Warm and well perfused  Neurologic Examination: Mental Status: Drowsy in the context of early AM exam. Able to follow all commands. Speech fluent.  Cranial Nerves: II:  Not cooperative with visual field testing. Counts fingers and names objects. Not cooperative with testing of pupillary light reflex. III,IV, VI: No ptosis. EOMI without nystagmus.  V,VII: Smile symmetric. Not cooperative with sensory testing VIII: hearing intact to voice IX,X: No hoarseness XI: Symmetric XII: Not cooperative. Motor: In the context of poor effort due to drowsiness. Strength is 4+/5 x 4 without asymmetry. Tone and bulk are normal.  Sensory: States he can feel  FT in each extremity Deep Tendon Reflexes:  1+ upper and lower extremity reflexes without asymmetry.  Cerebellar: Not cooperative Gait: Unable to assess   Results for orders placed or performed during the hospital encounter of 03/12/17 (from the past 48 hour(s))  Troponin I (q 6hr x 3)     Status: Abnormal   Collection Time: 03/15/17  7:56 AM  Result Value Ref Range   Troponin I 2.77 (HH) <0.03 ng/mL    Comment: CRITICAL VALUE NOTED.  VALUE IS CONSISTENT WITH PREVIOUSLY REPORTED AND CALLED VALUE. Performed at [facility], [place] [address], [place],  [address]   CK total and CKMB (cardiac)not at [facility]     Status: None   Collection Time: 03/15/17  7:56 AM  Result Value Ref Range  Total CK 197 49 - 397 U/L   CK, MB 3.3 0.5 - 5.0 ng/mL   Relative Index 1.7 0.0 - 2.5    Comment: Performed at [place] [address] [place], [place] [address]  Vitamin B12     Status: None   Collection Time: 03/15/17 10:12 AM  Result Value Ref Range   Vitamin B-12 838 180 - 914 pg/mL    Comment: (NOTE) This assay is not validated for testing neonatal or myeloproliferative syndrome specimens for Vitamin B12 levels. Performed at [facility], [place] [address], [place], [place] [address]   TSH     Status: None   Collection Time: 03/15/17 10:12 AM  Result Value Ref Range   TSH 1.747 0.350 - 4.500 uIU/mL    Comment: Performed by a 3rd Generation assay with a functional sensitivity of <=0.01 uIU/mL. Performed at [facility], [place] [address], [place], [place] [address]   T4, free     Status: None   Collection Time: 03/15/17 10:12 AM  Result Value Ref Range   Free T4 1.05 0.61 - 1.12 ng/dL    Comment: (NOTE) Biotin ingestion may interfere with free T4 tests. If the results are inconsistent with the TSH level, previous test results, or the clinical presentation, then consider biotin interference. If needed, order repeat testing after stopping  biotin. Performed at [facility], [place] [address], [place], [place] [address]   Ammonia     Status: None   Collection Time: 03/15/17 10:12 AM  Result Value Ref Range   Ammonia 28 9 - 35 umol/L    Comment: Performed at [facility], [place] [address] [place], [place] [address]  Troponin I (q 6hr x 3)     Status: Abnormal   Collection Time: 03/15/17  1:38 PM  Result Value Ref Range   Troponin I 2.58 (HH) <0.03 ng/mL    Comment: CRITICAL VALUE NOTED.  VALUE IS CONSISTENT WITH PREVIOUSLY REPORTED AND CALLED VALUE. Performed at [facility], [place] [address], [place], [place] [address]   Troponin I (q 6hr x 3)     Status: Abnormal   Collection Time: 03/15/17  6:14 PM  Result Value Ref Range   Troponin I 2.25 (HH) <0.03 ng/mL    Comment: CRITICAL VALUE NOTED.  VALUE IS CONSISTENT WITH PREVIOUSLY REPORTED AND CALLED VALUE. Performed at [facility], [place] [address], [place],  [address]   CBC with Differential/Platelet     Status: Abnormal   Collection Time: 03/16/17  4:53 AM  Result Value Ref Range   WBC 6.9 4.0 - 10.5 K/uL   RBC 3.95 (L) 4.22 - 5.81 MIL/uL   Hemoglobin 10.6 (L) 13.0 - 17.0 g/dL   HCT 32.8 (L) 39.0 - 52.0 %   MCV 83.0 78.0 - 100.0 fL   MCH 26.8 26.0 - 34.0 pg   MCHC 32.3 30.0 - 36.0 g/dL   RDW 13.9 11.5 - 15.5 %   Platelets 315 150 - 400 K/uL   Neutrophils Relative % 63 %   Neutro Abs 4.4 1.7 - 7.7 K/uL   Lymphocytes Relative 25 %   Lymphs Abs 1.7 0.7 - 4.0 K/uL   Monocytes Relative 9 %   Monocytes Absolute 0.6 0.1 - 1.0 K/uL   Eosinophils Relative 3 %   Eosinophils Absolute 0.2 0.0 - 0.7 K/uL   Basophils Relative 0 %   Basophils Absolute 0.0 0.0 - 0.1 K/uL  Comment: Performed at [facility], [place] [address], [place], [place] [address]  Phosphorus     Status: None   Collection Time: 03/16/17  4:53 AM  Result Value Ref Range   Phosphorus 3.5 2.5 - 4.6 mg/dL    Comment:  Performed at [facility], [place] [address] [place], [place] [address]  Comprehensive metabolic panel     Status: Abnormal   Collection Time: 03/16/17  4:53 AM  Result Value Ref Range   Sodium 143 135 - 145 mmol/L   Potassium 4.0 3.5 - 5.1 mmol/L   Chloride 106 101 - 111 mmol/L   CO2 26 22 - 32 mmol/L   Glucose, Bld 101 (H) 65 - 99 mg/dL   BUN 25 (H) 6 - 20 mg/dL   Creatinine, Ser 1.67 (H) 0.61 - 1.24 mg/dL   Calcium 9.5 8.9 - 10.3 mg/dL   Total Protein 7.0 6.5 - 8.1 g/dL   Albumin 3.9 3.5 - 5.0 g/dL   AST 47 (H) 15 - 41 U/L   ALT 83 (H) 17 - 63 U/L   Alkaline Phosphatase 69 38 - 126 U/L   Total Bilirubin 1.0 0.3 - 1.2 mg/dL   GFR calc non Af Amer 48 (L) >60 mL/min   GFR calc Af Amer 55 (L) >60 mL/min    Comment: (NOTE) The eGFR has been calculated using the CKD EPI equation. This calculation has not been validated in all clinical situations. eGFR's persistently <60 mL/min signify possible Chronic Kidney Disease.    Anion gap 11 5 - 15    Comment: Performed at [facility], [place] [address] [place], [place] [address]  Magnesium     Status: None   Collection Time: 03/16/17  4:53 AM  Result Value Ref Range   Magnesium 1.8 1.7 - 2.4 mg/dL    Comment: Performed at [facility], [place] [address] [place], [place] [address]  Protime-INR     Status: None   Collection Time: 03/16/17  4:53 AM  Result Value Ref Range   Prothrombin Time 12.5 11.4 - 15.2 seconds   INR 0.95     Comment: Performed at [facility], [place] [address] [place], [place] [address]   Mr Jodene Nam Head Wo Contrast  Result Date: 03/16/2017 CLINICAL DATA:  47 year old male with altered mental status and numerous punctate supratentorial acute infarcts on brain MRI yesterday. EXAM: MRA HEAD WITHOUT CONTRAST TECHNIQUE: Angiographic images of the Circle of Willis were obtained using MRA technique without intravenous contrast. COMPARISON:  Brain MRI  03/15/2017.  Noncontrast head CT 03/12/2017. FINDINGS: Study is mildly degraded by motion artifact. There is no intracranial mass effect or ventriculomegaly. Antegrade flow in the posterior circulation with codominant distal vertebral arteries. No distal vertebral stenosis. Both PICA origins are patent. Patent vertebrobasilar junction. Mildly tortuous and irregular basilar artery but no convincing basilar stenosis. SCA and PCA origins are patent and within normal limits, fetal type left PCA origin. The right posterior communicating artery is present. Bilateral PCA branches are within normal limits allowing for motion. Antegrade flow in both ICA siphons. No siphon stenosis. Ophthalmic and posterior communicating artery origins appear normal. The carotid termini are patent. The left ACA A1 segment is tortuous. The anterior communicating artery and visible ACA branches are within normal limits. Bilateral MCA origins and M1 segments appear normal. Both MCA bifurcations are patent. The visible bilateral MCA branches are within normal limits when allowing for some motion. IMPRESSION: Negative intracranial  MRA. Electronically Signed   By: Genevie Ann M.D.   On: 03/16/2017 12:57   Mr Brain Wo Contrast  Result Date: 03/15/2017 CLINICAL DATA:  Altered level of consciousness. Diagnosed with flu 3 days ago. Suspect stroke. History of hypertension. EXAM: MRI HEAD WITHOUT CONTRAST TECHNIQUE: Multiplanar, multiecho pulse sequences of the brain and surrounding structures were obtained without intravenous contrast. COMPARISON:  CT HEAD March 12, 2017 FINDINGS: Mild motion degraded examination. INTRACRANIAL CONTENTS: Greater than 20 subcentimeter relatively symmetric supratentorial and cortical foci of reduced diffusion including LEFT basal ganglia with low ADC values on identifiable lesions. Pontine microhemorrhage. Patchy supratentorial white matter FLAIR T2 hyperintensities exclusive a aforementioned abnormality. Old LEFT basal  ganglia lacunar infarct. No midline shift, mass effect or definite masses though limited by motion. No abnormal extra-axial fluid collections. No parenchymal brain volume loss for age. No hydrocephalus. VASCULAR: Normal major intracranial vascular flow voids present at skull base. SKULL AND UPPER CERVICAL SPINE: No abnormal sellar expansion. No suspicious calvarial bone marrow signal. Craniocervical junction maintained. SINUSES/ORBITS: The mastoid air-cells and included paranasal sinuses are well-aerated.The included ocular globes and orbital contents are non-suspicious. OTHER: None. IMPRESSION: 1. Motion degraded examination. 2. Numerous small supratentorial acute infarcts spanning multiple vascular territories. Differential diagnosis includes embolic phenomena, hypotensive episode, vasculopathy. 3. Mild chronic small vessel ischemic disease, advanced for age. 4. These results will be called to the ordering clinician or representative by the Radiologist Assistant, and communication documented in the PACS or zVision Dashboard. Electronically Signed   By: Elon Alas M.D.   On: 03/15/2017 19:49   EKG: NSR  TTE 2/26:  - Left ventricle: The cavity size was normal. Wall thickness was   increased in a pattern of severe LVH. There was severe concentric   hypertrophy. The estimated ejection fraction was in the range of   65% to 70%. Wall motion was normal; there were no regional wall   motion abnormalities. Doppler parameters are consistent with   abnormal left ventricular relaxation (grade 1 diastolic   dysfunction). - Left atrium: The atrium was mildly dilated. - No significant valvular abnormality.   Inadequate TR jet to estimate PASP. Normal right atrial pressure.  Carotid ultrasound: Right Carotid: Velocities in the right ICA are consistent with a 1-39% stenosis. Left Carotid: Velocities in the left ICA are consistent with a 40-59% stenosis. Vertebrals: Both vertebral arteries were patent with  antegrade flow  Assessment: 47 y.o. male with multifocal, subcentimeter, roughly symmetrically distributed bilateral subacute ischemic infarctions. The distribution appears most consistent with watershed infarctions; of note, he had been markedly hypotensive on initial presentation. 1. Neurological exam is nonfocal.  2. MRI brain: Numerous small supratentorial acute infarcts spanning multiple vascular territories. Differential diagnosis includes embolic phenomena, hypotensive episode and vasculopathy.  3. MRA head: Normal. 4. Carotid ultrasound: 1-39% right ICA stenosis, 40-59% left ICA stenosis 5. EEG: Normal.   Plan: 1. TEE planned for Monday. 2. PT consult, OT consult, Speech consult 3. Continue ASA and atorvastatin 4. Telemetry monitoring  '@Electronically'  signed: Dr. Kerney Elbe  03/17/2017, 6:05 AM

## 2017-03-17 NOTE — Evaluation (Signed)
Occupational Therapy Evaluation Patient Details Name: Anthony Mcdaniel MRN: [id number] DOB: 09/27/1970 Today's Date: 03/17/2017    History of Present Illness Pt with PMH of uncontrolled hypertension; admitted on 03/12/2017, presented with complaint of confusion, was found to have acute encephalopathy, acute kidney injury as well as metabolic acidosis, recent flu with generalized weakness priro to admission; MRI on 03/15/17 revealed numerous small supratentorial acute infarcts spanning multiple vascular territories.   Clinical Impression   Pt admitted with the above diagnoses and presents with below problem list. Pt will benefit from continued acute OT to address the below listed deficits and maximize independence with basic ADLs prior to d/c to venue below. PTA pt was independent with ADLs. Pt is currently min guard to min A with LB ADLs and functional transfers/mobility.       Follow Up Recommendations  Home health OT;Supervision - Intermittent;Other (comment)(OOB/mobility)    Equipment Recommendations  3 in 1 bedside commode    Recommendations for Other Services       Precautions / Restrictions Precautions Precautions: Fall Restrictions Weight Bearing Restrictions: No      Mobility Bed Mobility Overal bed mobility: Needs Assistance Bed Mobility: Supine to Sit;Sit to Supine     Supine to sit: Min guard;Supervision Sit to supine: Min guard;Supervision   General bed mobility comments: for safety  Transfers Overall transfer level: Needs assistance Equipment used: None;1 person hand held assist Transfers: Sit to/from Stand Sit to Stand: Min assist;Min guard         General transfer comment: to rise and steady    Balance Overall balance assessment: Needs assistance Sitting-balance support: No upper extremity supported;Feet supported Sitting balance-Leahy Scale: Good       Standing balance-Leahy Scale: Fair                             ADL either performed  or assessed with clinical judgement   ADL Overall ADL's : Needs assistance/impaired Eating/Feeding: Set up;Sitting   Grooming: Oral care;Min guard;Standing Grooming Details (indicate cue type and reason): mild FMC deficits noted during managing oral care items Upper Body Bathing: Set up;Sitting   Lower Body Bathing: Minimal assistance;Sit to/from stand;Min guard   Upper Body Dressing : Set up;Sitting   Lower Body Dressing: Min guard;Minimal assistance;Sit to/from stand   Toilet Transfer: Min guard;Minimal assistance;Ambulation Toilet Transfer Details (indicate cue type and reason): mostly close min guard, occasional min A to steady on turns PsychiatristToileting- Clothing Manipulation and Hygiene: Min guard;Sit to/from stand   Tub/ Shower Transfer: Min guard;Tub transfer;Minimal assistance;Ambulation;3 in 1;Rolling walker   Functional mobility during ADLs: Min guard;Rolling walker;Minimal assistance General ADL Comments: Pt completed bed mobility, household distance functinoal mobility and oral care standing at sink. Mostly close min guard for mobility, occasional min A to steady on turns     Vision Patient Visual Report: No change from baseline       Perception     Praxis      Pertinent Vitals/Pain Pain Assessment: No/denies pain     Hand Dominance Left   Extremity/Trunk Assessment Upper Extremity Assessment Upper Extremity Assessment: RUE deficits/detail;LUE deficits/detail RUE Deficits / Details: AROM grossly WFL; strength 4 to 5/5 RUE Coordination: decreased fine motor LUE Deficits / Details: AROM grossly WFL; strength 4 to 5/5   Lower Extremity Assessment Lower Extremity Assessment: Defer to PT evaluation       Communication Communication Communication: Other (comment)(difficult to understand at times, low volume)   Cognition  Arousal/Alertness: Awake/alert Behavior During Therapy: Flat affect Overall Cognitive Status: Impaired/Different from baseline Area of  Impairment: Orientation;Following commands;Problem solving;Memory                       Following Commands: Follows one step commands with increased time;Follows multi-step commands with increased time     Problem Solving: Slow processing General Comments: limited verbalization but content is appropriate   General Comments       Exercises     Shoulder Instructions      Home Living Family/patient expects to be discharged to:: Private residence Living Arrangements: Spouse/significant other Available Help at Discharge: Family Type of Home: House Home Access: Stairs to enter [employer] of Steps: pt does not recall/unsure   Home Layout: Two level;Bed/bath upstairs Alternate Level Stairs-Number of Steps: full flight of stairs to access bedroom on second floor             Home Equipment: None          Prior Functioning/Environment Level of Independence: Independent        Comments: pt works as Sports administrator Problem List: Decreased activity tolerance;Impaired balance (sitting and/or standing);Decreased coordination;Decreased knowledge of use of DME or AE;Decreased knowledge of precautions      OT Treatment/Interventions: Self-care/ADL training;Therapeutic exercise;DME and/or AE instruction;Therapeutic activities;Patient/family education;Balance training    OT Goals(Current goals can be found in the care plan section) Acute Rehab OT Goals Patient Stated Goal: none stated OT Goal Formulation: With patient Time For Goal Achievement: 03/31/17 Potential to Achieve Goals: Good ADL Goals Pt Will Perform Lower Body Bathing: with modified independence;sit to/from stand Pt Will Perform Lower Body Dressing: with modified independence;sit to/from stand Pt Will Transfer to Toilet: with modified independence;ambulating Pt Will Perform Toileting - Clothing Manipulation and hygiene: with modified independence Pt Will Perform Tub/Shower  Transfer: with modified independence;Tub transfer;with supervision;ambulating Additional ADL Goal #1: Pt will be independent with [facility] exercises to facilitate safety with ADLs.  OT Frequency: Min 2X/week   Barriers to D/C:            Co-evaluation              AM-PAC PT "6 Clicks" Daily Activity     Outcome Measure Help from another person eating meals?: None Help from another person taking care of personal grooming?: A Little Help from another person toileting, which includes using toliet, bedpan, or urinal?: A Little Help from another person bathing (including washing, rinsing, drying)?: A Little Help from another person to put on and taking off regular upper body clothing?: None Help from another person to put on and taking off regular lower body clothing?: A Little 6 Click Score: 20   End of Session Equipment Utilized During Treatment: Gait belt  Activity Tolerance: Patient tolerated treatment well Patient left: in bed;with call bell/phone within reach;with bed alarm set;with SCD's reapplied;with family/visitor present  OT Visit Diagnosis: Unsteadiness on feet (R26.81);Other abnormalities of gait and mobility (R26.89)                Time: 1610-9604 OT Time Calculation (min): 15 min Charges:  OT General Charges $OT Visit: 1 Visit OT Evaluation $OT Eval Low Complexity: 1 Low G-Codes:       Pilar Grammes 03/17/2017, 4:39 PM

## 2017-03-17 NOTE — Progress Notes (Signed)
[facility] Progress Note  Patient: Anthony Mcdaniel JXB:[id number]   PCP: Mila Palmer, MD DOB: Dec 02, 1970   DOA: 03/12/2017   DOS: 03/17/2017   Date of Service: the patient was seen and examined on 03/17/2017  Subjective: He reports no new complaints currently  Brief hospital course: Pt. with PMH of uncontrolled hypertension; admitted on 03/12/2017, presented with complaint of confusion, was found to have acute encephalopathy, acute kidney injury as well as metabolic acidosis.  Cardiac Cath canceled once patient was found to have stroke and patient transitioned to [facility] for neurology evaluation.  Assessment and Plan: 1.  Acute kidney injury. Hyponatremia Likely from dehydration from influenza. Also possibility of severe hypotension causing ischemic injury. Patient was hydrated aggressively with significant improvement in renal function. Presented with serum creatinine of 6.5, baseline creatinine 1.48, Current creatinine is at 1.6 CTA renal protocol negative for hydronephrosis. Continue IV fluids. Avoid nephrotoxic medication. - reasses bmp next am.  2. Hypotension with hypovolemic shock. Presented with severe dehydration required aggressive hydration as well as IV phenylephrine drip. Now off the pressor and the blood pressure is elevated. Patient appears to have uncontrolled hypertension at his baseline and is on multiple antihypertensive medications. - currently on imdur and bystolic. Stable currently  3.  Acute encephalopathy. Possibly metabolic in nature from presentation with severe hypotension. CT scan of the head unremarkable.  Metabolic workup also unremarkable.  Do not appear to have any active infection. EEG negative for any focus of seizures. MRI brain shows multiple numerous small acute infarct, possibly watershed but cardioembolic phenomenon cannot be ruled out. Pt currently undergoing stroke work up by neurology Patient will get a TEE on Monday. PT  OT recommends no therapy although patient is seen staggering while ambulating.  Recommend evaluation.  4.  Elevated troponin. Likely demand ischemia from hypotension followed by hypertension. Also poor clearance from acute on chronic kidney disease. Echocardiogram was performed, preserved EF without any wall motion abnormalities.  Patient denies having any chest pain. EKG unremarkable. Having multiple PVCs on telemetry. Appreciate cardiology input.  Initial plan was to perform a cardiac catheterization due to prolonged PVC episode will need neurology clearance before going for cardiac cath. TEE scheduled on Monday.  5.  Anemia of chronic kidney disease. Monitor H&H.   Diet: Renal diet DVT Prophylaxis: subcutaneous Heparin  Advance goals of care discussion: Full code  Family Communication: no family was present at bedside, at the time of interview.  Discussed with patient's wife on phone on 03/16/2017.  Disposition:  TBD  Consultants: cardiology neurology PCCM primary  Procedures: Echocardiogram   Antibiotics: Anti-infectives (From admission, onward)   None       Objective: Physical Exam: Vitals:   03/17/17 0022 03/17/17 0549 03/17/17 0836 03/17/17 1347  BP:  136/74 138/81 123/78  Pulse:  78 70 69  Resp:  18 20 20   Temp:  99 F (37.2 C) 98.9 F (37.2 C) 98.7 F (37.1 C)  TempSrc:  Oral Oral Oral  SpO2:  95% 97% 96%  Weight: 121.6 kg (268 lb 1.3 oz)     Height: 6\' 1"  (1.854 m)       Intake/Output Summary (Last 24 hours) at 03/17/2017 1523 Last data filed at 03/17/2017 1200 Gross per 24 hour  Intake 1741 ml  Output 1500 ml  Net 241 ml   Filed Weights   03/16/17 0423 03/16/17 1900 03/17/17 0022  Weight: 117.9 kg (259 lb 14.8 oz) 117.9 kg (259 lb 14.8 oz) 121.6 kg (268  lb 1.3 oz)   General: Pt is alert and awake and in nad. Eyes: PERRL, Conjunctiva normal ENT: Moist mucous membranes Neck: no JVD, no Abnormal Mass Or lumps Cardiovascular: S1 and S2 Present, no  Murmur, Peripheral Pulses Present Respiratory: normal respiratory effort, Bilateral Air entry equal and Decreased, no use of accessory muscle, Clear to Auscultation, no Crackles, no wheezes Abdomen: Bowel Sound present, Soft and no tenderness, no hernia Skin: no redness, no Rash, no induration Extremities: no Pedal edema, no calf tenderness Neurologic: Grossly no focal neuro deficit. Bilaterally Equal motor strength Mental status AAOx2, speech normal, attention poor,  Cerebellar test normal finger nose finger. Gait not checked due to patient safety concerns.   Data Reviewed: CBC: Recent Labs  Lab 03/12/17 1053 03/13/17 0030 03/14/17 0259 03/15/17 0442 03/16/17 0453  WBC 6.9 6.9 5.7 5.7 6.9  NEUTROABS 5.3  --  3.9 3.5 4.4  HGB 10.4* 10.4* 10.4* 11.7* 10.6*  HCT 30.9* 31.1* 32.9* 36.6* 32.8*  MCV 80.9 81.2 85.0 84.3 83.0  PLT 220 223 225 279 315   Basic Metabolic Panel: Recent Labs  Lab 03/13/17 0030 03/13/17 0748 03/13/17 1747 03/13/17 2340 03/14/17 0259 03/15/17 0442 03/16/17 0453  NA 145 148* 151* 149*  --  145 143  K 3.8 3.9 4.2 4.3  --  4.5 4.0  CL 109 107 109 109  --  106 106  CO2 24 28 32 31  --  27 26  GLUCOSE 129* 128* 114* 116*  --  101* 101*  BUN 77* 63* 47* 40*  --  25* 25*  CREATININE 3.92* 2.89* 2.43* 2.25*  --  1.71* 1.67*  CALCIUM 8.3* 8.4* 9.0 9.1  --  9.5 9.5  MG 1.9  --  2.1  --  1.9 2.1 1.8  PHOS 3.3  --  2.7  --  2.5 3.3 3.5    Liver Function Tests: Recent Labs  Lab 03/12/17 1053 03/14/17 0259 03/16/17 0453  AST 67* 98* 47*  ALT 75* 102* 83*  ALKPHOS 46 59 69  BILITOT 0.8 1.1 1.0  PROT 6.3* 7.3 7.0  ALBUMIN 3.7 4.0 3.9   Recent Labs  Lab 03/12/17 1257  LIPASE 38   Recent Labs  Lab 03/12/17 1257 03/15/17 1012  AMMONIA 34 28   Coagulation Profile: Recent Labs  Lab 03/16/17 0453  INR 0.95   Cardiac Enzymes: Recent Labs  Lab 03/14/17 0259 03/14/17 0713 03/15/17 0756 03/15/17 1338 03/15/17 1814  CKTOTAL  --   --  197   --   --   CKMB  --   --  3.3  --   --   TROPONINI 2.64* 2.64* 2.77* 2.58* 2.25*   BNP (last 3 results) No results for input(s): PROBNP in the last 8760 hours. CBG: Recent Labs  Lab 03/12/17 1319  GLUCAP 97   Studies: No results found.  Scheduled Meds: .  stroke: mapping our early stages of recovery book   Does not apply Once  . aspirin EC  325 mg Oral Daily  . atorvastatin  80 mg Oral q1800  . heparin  5,000 Units Subcutaneous Q8H  . isosorbide mononitrate  30 mg Oral Daily  . nebivolol  5 mg Oral Daily  . sodium chloride flush  3 mL Intravenous Q12H   Continuous Infusions: . sodium chloride 1,000 mL (03/16/17 2032)  . sodium chloride     PRN Meds:   Time spent: 35 minutes  Author: Penny Piarlando Franciso Dierks [facility] Pager: [phone] 03/17/2017 3:23  PM  If 7PM-7AM, please contact night-coverage at [email or web address], password [facility]

## 2017-03-17 NOTE — Progress Notes (Signed)
Patient arrived on 3W unit via [facility] at appx 0022AM. He was pleasant, calm and cooperative. NS infusing at 10575mL/hr. Denied pain, tolerated transfer without adverse effects. NIH score 1.

## 2017-03-18 DIAGNOSIS — I6330 Cerebral infarction due to thrombosis of unspecified cerebral artery: Secondary | ICD-10-CM

## 2017-03-18 DIAGNOSIS — I9589 Other hypotension: Secondary | ICD-10-CM

## 2017-03-18 DIAGNOSIS — R0602 Shortness of breath: Secondary | ICD-10-CM

## 2017-03-18 DIAGNOSIS — E861 Hypovolemia: Secondary | ICD-10-CM

## 2017-03-18 LAB — BASIC METABOLIC PANEL
ANION GAP: 11 (ref 5–15)
Anion gap: 11 (ref 5–15)
BUN: 16 mg/dL (ref 6–20)
BUN: 16 mg/dL (ref 6–20)
CHLORIDE: 102 mmol/L (ref 101–111)
CHLORIDE: 104 mmol/L (ref 101–111)
CO2: 24 mmol/L (ref 22–32)
CO2: 22 mmol/L (ref 22–32)
Calcium: 8.9 mg/dL (ref 8.9–10.3)
Calcium: 8.9 mg/dL (ref 8.9–10.3)
Creatinine, Ser: 1.64 mg/dL — ABNORMAL HIGH (ref 0.61–1.24)
Creatinine, Ser: 1.6 mg/dL — ABNORMAL HIGH (ref 0.61–1.24)
GFR calc Af Amer: 56 mL/min — ABNORMAL LOW (ref >60)
GFR calc Af Amer: 58 mL/min — ABNORMAL LOW (ref >60)
GFR calc non Af Amer: 49 mL/min — ABNORMAL LOW (ref >60)
GFR calc non Af Amer: 50 mL/min — ABNORMAL LOW (ref >60)
GLUCOSE: 89 mg/dL (ref 65–99)
GLUCOSE: 87 mg/dL (ref 65–99)
POTASSIUM: 3.8 mmol/L (ref 3.5–5.1)
POTASSIUM: 3.7 mmol/L (ref 3.5–5.1)
Sodium: 137 mmol/L (ref 135–145)
Sodium: 137 mmol/L (ref 135–145)

## 2017-03-18 LAB — CBC
HEMATOCRIT: 30.7 % — AB (ref 39.0–52.0)
Hemoglobin: 10 g/dL — ABNORMAL LOW (ref 13.0–17.0)
MCH: 26.6 pg (ref 26.0–34.0)
MCHC: 32.6 g/dL (ref 30.0–36.0)
MCV: 81.6 fL (ref 78.0–100.0)
Platelets: 329 10*3/uL (ref 150–400)
RBC: 3.76 MIL/uL — ABNORMAL LOW (ref 4.22–5.81)
RDW: 13.9 % (ref 11.5–15.5)
WBC: 6.1 10*3/uL (ref 4.0–10.5)

## 2017-03-18 LAB — HEMOGLOBIN A1C
Hgb A1c MFr Bld: 5.7 % — ABNORMAL HIGH (ref 4.8–5.6)
Mean Plasma Glucose: 116.89 mg/dL

## 2017-03-18 NOTE — Progress Notes (Signed)
[facility] Progress Note  Patient: Anthony Mcdaniel ZOX:[id number]   PCP: Mila Palmer, MD DOB: 1970/07/03   DOA: 03/12/2017   DOS: 03/18/2017   Date of Service: the patient was seen and examined on 03/18/2017  Subjective: He reports no new complaints currently  Brief hospital course: Pt. with PMH of uncontrolled hypertension; admitted on 03/12/2017, presented with complaint of confusion, was found to have acute encephalopathy, acute kidney injury as well as metabolic acidosis.  Cardiac Cath canceled once patient was found to have stroke and patient transitioned to [facility] for neurology evaluation.  Assessment and Plan: 1.  Acute kidney injury. Hyponatremia Likely from dehydration from influenza. Also possibility of severe hypotension causing ischemic injury. Patient was hydrated aggressively with significant improvement in renal function. Presented with serum creatinine of 6.5, baseline creatinine 1.48, Current creatinine is at 1.6 CTA renal protocol negative for hydronephrosis. - reasses bmp next am.  2. Hypotension with hypovolemic shock. Presented with severe dehydration required aggressive hydration as well as IV phenylephrine drip. Now off the pressor and the blood pressure is elevated. Patient appears to have uncontrolled hypertension at his baseline and is on multiple antihypertensive medications. - currently on imdur and bystolic. Stable currently  3.  Acute encephalopathy. Possibly metabolic in nature from presentation with severe hypotension. CT scan of the head unremarkable.  Metabolic workup also unremarkable.  Do not appear to have any active infection. EEG negative for any focus of seizures. MRI brain shows multiple numerous small acute infarct, possibly watershed but cardioembolic phenomenon cannot be ruled out. Patient underwent stroke workup and neurology on board Patient will get a TEE on Monday. Improving but patient is still somnolent. Per my  discussion with Neurology consideration should be made to allowing some time off of work until patient can be reevaluated by primary care physician and be cleared to work.. I will most likely given him a week off of work  4.  Elevated troponin. Likely demand ischemia from hypotension followed by hypertension. Also poor clearance from acute on chronic kidney disease. Echocardiogram was performed, preserved EF without any wall motion abnormalities.  Patient denies having any chest pain. EKG unremarkable. Having multiple PVCs on telemetry. Appreciate cardiology input.  Initial plan was to perform a cardiac catheterization due to prolonged PVC episode will need neurology clearance before going for cardiac cath. TEE scheduled on Monday.  5.  Anemia of chronic kidney disease. Monitor H&H.   Diet: Renal diet DVT Prophylaxis: subcutaneous Heparin  Advance goals of care discussion: Full code  Family Communication: no family was present at bedside, at the time of interview.  Discussed with patient's wife on phone on 03/16/2017.  Disposition:  TBD  Consultants: cardiology neurology PCCM primary  Procedures: Echocardiogram   Antibiotics: Anti-infectives (From admission, onward)   None       Objective: Physical Exam: Vitals:   03/17/17 2349 03/18/17 0410 03/18/17 1114 03/18/17 1142  BP: (!) 128/56 (!) 110/59 (!) 148/79   Pulse: 70 74 63   Resp: 18 18 20    Temp: 99.3 F (37.4 C) 99.2 F (37.3 C) 98.5 F (36.9 C)   TempSrc: Oral Oral Oral   SpO2: 99% 96% 98%   Weight:    116.1 kg (255 lb 15.3 oz)  Height:        Intake/Output Summary (Last 24 hours) at 03/18/2017 1338 Last data filed at 03/17/2017 2300 Gross per 24 hour  Intake -  Output 250 ml  Net -250 ml   [employer]  03/16/17 1900 03/17/17 0022 03/18/17 1142  Weight: 117.9 kg (259 lb 14.8 oz) 121.6 kg (268 lb 1.3 oz) 116.1 kg (255 lb 15.3 oz)   General: Pt is alert and awake and in nad. Eyes: PERRL, Conjunctiva  normal ENT: Moist mucous membranes Neck: no JVD, no Abnormal Mass Or lumps Cardiovascular: S1 and S2 Present, no Murmur, Peripheral Pulses Present Respiratory: normal respiratory effort, Bilateral Air entry equal and Decreased, no use of accessory muscle, Clear to Auscultation, no Crackles, no wheezes Abdomen: Bowel Sound present, Soft and no tenderness, no hernia Skin: no redness, no Rash, no induration Extremities: no Pedal edema, no calf tenderness Neurologic: Grossly no focal neuro deficit. Bilaterally Equal motor strength, no facial asymmetry   Data Reviewed: CBC: Recent Labs  Lab 03/12/17 1053 03/13/17 0030 03/14/17 0259 03/15/17 0442 03/16/17 0453 03/18/17 0357  WBC 6.9 6.9 5.7 5.7 6.9 6.1  NEUTROABS 5.3  --  3.9 3.5 4.4  --   HGB 10.4* 10.4* 10.4* 11.7* 10.6* 10.0*  HCT 30.9* 31.1* 32.9* 36.6* 32.8* 30.7*  MCV 80.9 81.2 85.0 84.3 83.0 81.6  PLT 220 223 225 279 315 329   Basic Metabolic Panel: Recent Labs  Lab 03/13/17 0030  03/13/17 1747 03/13/17 2340 03/14/17 0259 03/15/17 0442 03/16/17 0453 03/18/17 0007 03/18/17 0357  NA 145   < > 151* 149*  --  145 143 137 137  K 3.8   < > 4.2 4.3  --  4.5 4.0 3.8 3.7  CL 109   < > 109 109  --  106 106 102 104  CO2 24   < > 32 31  --  27 26 24 22   GLUCOSE 129*   < > 114* 116*  --  101* 101* 89 87  BUN 77*   < > 47* 40*  --  25* 25* 16 16  CREATININE 3.92*   < > 2.43* 2.25*  --  1.71* 1.67* 1.64* 1.60*  CALCIUM 8.3*   < > 9.0 9.1  --  9.5 9.5 8.9 8.9  MG 1.9  --  2.1  --  1.9 2.1 1.8  --   --   PHOS 3.3  --  2.7  --  2.5 3.3 3.5  --   --    < > = values in this interval not displayed.    Liver Function Tests: Recent Labs  Lab 03/12/17 1053 03/14/17 0259 03/16/17 0453  AST 67* 98* 47*  ALT 75* 102* 83*  ALKPHOS 46 59 69  BILITOT 0.8 1.1 1.0  PROT 6.3* 7.3 7.0  ALBUMIN 3.7 4.0 3.9   Recent Labs  Lab 03/12/17 1257  LIPASE 38   Recent Labs  Lab 03/12/17 1257 03/15/17 1012  AMMONIA 34 28   Coagulation  Profile: Recent Labs  Lab 03/16/17 0453  INR 0.95   Cardiac Enzymes: Recent Labs  Lab 03/14/17 0259 03/14/17 0713 03/15/17 0756 03/15/17 1338 03/15/17 1814  CKTOTAL  --   --  197  --   --   CKMB  --   --  3.3  --   --   TROPONINI 2.64* 2.64* 2.77* 2.58* 2.25*   BNP (last 3 results) No results for input(s): PROBNP in the last 8760 hours. CBG: Recent Labs  Lab 03/12/17 1319  GLUCAP 97   Studies: No results found.  Scheduled Meds: .  stroke: mapping our early stages of recovery book   Does not apply Once  . aspirin EC  325 mg Oral Daily  .  atorvastatin  80 mg Oral q1800  . heparin  5,000 Units Subcutaneous Q8H  . isosorbide mononitrate  30 mg Oral Daily  . nebivolol  5 mg Oral Daily  . sodium chloride flush  3 mL Intravenous Q12H   Continuous Infusions: . sodium chloride 1,000 mL (03/16/17 2032)  . sodium chloride     PRN Meds:   Time spent: 35 minutes  Author: Penny Pia [facility] Pager: [phone] 03/18/2017 1:38 PM  If 7PM-7AM, please contact night-coverage at [email or web address], password [facility]

## 2017-03-18 NOTE — Evaluation (Signed)
Speech Language Pathology Evaluation Patient Details Name: Anthony Mcdaniel MRN: [id number] DOB: [birth year] Today's Date: 03/18/2017 Time: 4098-1191 SLP Time Calculation (min) (ACUTE ONLY): 18 min  Problem List:  Patient Active Problem List   Diagnosis Date Noted  . Cerebral thrombosis with cerebral infarction 03/17/2017  . Acute metabolic encephalopathy 03/12/2017  . Acute renal failure (HCC)   . Hypertensive emergency 03/18/2015  . Hypokalemia 03/18/2015  . Chronic renal disease, stage III (HCC) 03/18/2015  . Elevated troponin 03/18/2015   Past Medical History:  Past Medical History:  Diagnosis Date  . Hypertension    Past Surgical History: History reviewed. No pertinent surgical history. HPI:  This is a 47 year old male patient who was diagnosed with influenza on 2/20 at an urgent care. Leading up to that had to have a typical symptoms of cough, fever, weakness and fatigue. Treatment had been supportive in nature, his p.o. intake has been poor, he had been taking his oral antihypertensives and diuretics up until 2/20 however his oral intake had been poor leading up to that. Per his wife the following few days after being diagnosed he remained quite weak staying primarily in bed. On the a.m. of 2/25 he was too weak to get out of bed, he was incoherent, and mumbling only. Because of this EMS was called. In the emergency room he was found to be hypotensive with systolic blood pressure in the 70s-80s, his serum bicarbonate was 18, his BUN was 112 and creatinine 6.54. An venous blood gas was obtained showing a pH of 7.23, and serum bicarbonate 20.1. He was given 4 L of normal saline with improvement in his blood pressure, CT of abdomen pelvis was obtained this was negative for pathology specifically No evidence of regional calculi or hydronephrosis. Because of his acute encephalopathy as well as metabolic derangements and acute kidney injury critical care was asked to admit. MRI revealed  numerous small supratentorial acute infarcts spanning multiple vascular territories which differential diagnosis includes embolic phenomena, hypotensive episode, vasculopathy and mild chronic small vessel ischemic disease, advanced for age.    Assessment / Plan / Recommendation Clinical Impression  Pt presents with moderate cognitive deficits as evidenced by score of 13 out of 30 on MOCA version 8.1 (n=>26). Pt with specific deficits in recall of new information, basic problem solving, sustained attention, and intellectual awareness of cognitive deficits. Education provided on CVA and cognitive deficits. Wife voices increased understanding and prompts pt appropriately during tasks however pt continues to demonstrate decreased understanding of new cognitive deficits and impact on pt's life/ability to return to work. Recommend follow up Outpatient ST services, 24 hour supervision at discharge. ST to follow acutely.     SLP Assessment  SLP Recommendation/Assessment: Patient needs continued Speech Lanaguage Pathology Services SLP Visit Diagnosis: Cognitive communication deficit (R41.841)    Follow Up Recommendations  Outpatient SLP    Frequency and Duration min 2x/week  2 weeks      SLP Evaluation Cognition  Overall Cognitive Status: Impaired/Different from baseline Arousal/Alertness: Awake/alert Orientation Level: Oriented to person;Oriented to place;Oriented to situation(not year) Attention: Sustained Sustained Attention: Impaired Sustained Attention Impairment: Verbal complex;Functional complex Memory: Impaired Memory Impairment: Decreased recall of new information;Decreased short term memory Decreased Short Term Memory: Verbal basic;Functional basic Awareness: Impaired Awareness Impairment: Intellectual impairment Problem Solving: Impaired Problem Solving Impairment: Verbal basic;Functional basic Executive Function: Reasoning;Sequencing;Organizing;Decision Making;Self Monitoring;Self  Correcting Reasoning: Impaired Reasoning Impairment: Verbal basic;Functional basic Sequencing: Impaired Sequencing Impairment: Verbal basic;Functional basic Organizing: Impaired Organizing Impairment: Verbal basic;Functional basic Decision Making:  Impaired Decision Making Impairment: Verbal basic;Functional basic Self Monitoring: Impaired Self Monitoring Impairment: Verbal basic;Functional basic Self Correcting: Impaired Self Correcting Impairment: Verbal basic;Functional basic Safety/Judgment: Impaired       Comprehension  Auditory Comprehension Overall Auditory Comprehension: Appears within functional limits for tasks assessed Visual Recognition/Discrimination Discrimination: Within Function Limits Reading Comprehension Reading Status: Within funtional limits    Expression Expression Primary Mode of Expression: Verbal Verbal Expression Overall Verbal Expression: Appears within functional limits for tasks assessed Written Expression Dominant Hand: Left Written Expression: Within Functional Limits   Oral / Motor  Oral Motor/Sensory Function Overall Oral Motor/Sensory Function: Within functional limits Motor Speech Overall Motor Speech: Appears within functional limits for tasks assessed   GO                    Anthony Mcdaniel 03/18/2017, 11:28 AM

## 2017-03-18 NOTE — Plan of Care (Signed)
  Health Behavior/Discharge Planning: Ability to manage health-related needs will improve 03/18/2017 0049 - Progressing by Azzie RoupEdoh, Kaisy Severino I, RN   Clinical Measurements: Ability to maintain clinical measurements within normal limits will improve 03/18/2017 0049 - Progressing by Ashley RoyaltyEdoh, Theadora Noyes I, RN   Clinical Measurements: Will remain free from infection 03/18/2017 0049 - Progressing by Ashley RoyaltyEdoh, Sanskriti Greenlaw I, RN   Health Behavior/Discharge Planning: Ability to manage health-related needs will improve 03/18/2017 0049 - Progressing by Ashley RoyaltyEdoh, Glyndon Tursi I, RN

## 2017-03-18 NOTE — Evaluation (Signed)
Clinical/Bedside Swallow Evaluation Patient Details  Name: Anthony Mcdaniel MRN: [id number] Date of Birth: 07/19/1970  Today's Date: 03/18/2017 Time: SLP Start Time (ACUTE ONLY): 1030 SLP Stop Time (ACUTE ONLY): 1045 SLP Time Calculation (min) (ACUTE ONLY): 15 min  Past Medical History:  Past Medical History:  Diagnosis Date  . Hypertension    Past Surgical History: History reviewed. No pertinent surgical history. HPI:  This is a 47 year old male patient who was diagnosed with influenza on 2/20 at an urgent care. Leading up to that had to have a typical symptoms of cough, fever, weakness and fatigue. Treatment had been supportive in nature, his p.o. intake has been poor, he had been taking his oral antihypertensives and diuretics up until 2/20 however his oral intake had been poor leading up to that. Per his wife the following few days after being diagnosed he remained quite weak staying primarily in bed. On the a.m. of 2/25 he was too weak to get out of bed, he was incoherent, and mumbling only. Because of this EMS was called. In the emergency room he was found to be hypotensive with systolic blood pressure in the 70s-80s, his serum bicarbonate was 18, his BUN was 112 and creatinine 6.54. An venous blood gas was obtained showing a pH of 7.23, and serum bicarbonate 20.1. He was given 4 L of normal saline with improvement in his blood pressure, CT of abdomen pelvis was obtained this was negative for pathology specifically No evidence of regional calculi or hydronephrosis. Because of his acute encephalopathy as well as metabolic derangements and acute kidney injury critical care was asked to admit. MRI revealed numerous small supratentorial acute infarcts spanning multiple vascular territories which differential diagnosis includes embolic phenomena, hypotensive episode, vasculopathy and mild chronic small vessel ischemic disease, advanced for age.    Assessment / Plan / Recommendation Clinical  Impression  Pt presents with reduced risk of aspiration when following general aspiration precautions. Pt consumed trials of regular solids, mixed consistencies and thin liquids via straw without overt s/s of aspiration. Recommend regular diet with thin liquids, medicine whole with thin liquids. Pt to be NPO at midnight for procedure but my return to regular diet once alert. No oropharyngeal dysphagia noted. ST to sign off for dysphagia.  SLP Visit Diagnosis: Dysphagia, unspecified (R13.10)    Aspiration Risk  No limitations    Diet Recommendation Regular;Thin liquid   Liquid Administration via: Straw;Cup Medication Administration: Whole meds with liquid Supervision: Patient able to self feed Compensations: Minimize environmental distractions;Slow rate;Small sips/bites Postural Changes: Seated upright at 90 degrees    Other  Recommendations Oral Care Recommendations: Oral care BID   Follow up Recommendations (none for dysphagia)        Swallow Study   General Date of Onset: 03/12/17 HPI: This is a 47 year old male patient who was diagnosed with influenza on 2/20 at an urgent care. Leading up to that had to have a typical symptoms of cough, fever, weakness and fatigue. Treatment had been supportive in nature, his p.o. intake has been poor, he had been taking his oral antihypertensives and diuretics up until 2/20 however his oral intake had been poor leading up to that. Per his wife the following few days after being diagnosed he remained quite weak staying primarily in bed. On the a.m. of 2/25 he was too weak to get out of bed, he was incoherent, and mumbling only. Because of this EMS was called. In the emergency room he was found to be hypotensive with systolic  blood pressure in the 70s-80s, his serum bicarbonate was 18, his BUN was 112 and creatinine 6.54. An venous blood gas was obtained showing a pH of 7.23, and serum bicarbonate 20.1. He was given 4 L of normal saline with  improvement in his blood pressure, CT of abdomen pelvis was obtained this was negative for pathology specifically No evidence of regional calculi or hydronephrosis. Because of his acute encephalopathy as well as metabolic derangements and acute kidney injury critical care was asked to admit. MRI revealed numerous small supratentorial acute infarcts spanning multiple vascular territories which differential diagnosis includes embolic phenomena, hypotensive episode, vasculopathy and mild chronic small vessel ischemic disease, advanced for age.  Type of Study: Bedside Swallow Evaluation Previous Swallow Assessment: none in chart Diet Prior to this Study: Thin liquids Temperature Spikes Noted: No Respiratory Status: Room air History of Recent Intubation: No Behavior/Cognition: Alert;Cooperative;Pleasant mood Oral Cavity Assessment: Within Functional Limits Oral Care Completed by SLP: No Oral Cavity - Dentition: Adequate natural dentition Vision: Functional for self-feeding Self-Feeding Abilities: Able to feed self Patient Positioning: Upright in bed Baseline Vocal Quality: Normal Volitional Cough: Strong Volitional Swallow: Able to elicit    Oral/Motor/Sensory Function Overall Oral Motor/Sensory Function: Within functional limits   Ice Chips Ice chips: Within functional limits   Thin Liquid Thin Liquid: Within functional limits    Nectar Thick Nectar Thick Liquid: Not tested   Honey Thick Honey Thick Liquid: Not tested   Puree Puree: Not tested   Solid   GO   Solid: Within functional limits Presentation: Self Fed;Spoon Other Comments: (including mixed consistencies)        Avannah Decker 03/18/2017,11:12 AM

## 2017-03-18 NOTE — Progress Notes (Signed)
STROKE TEAM PROGRESS NOTE   HISTORY OF PRESENT ILLNESS (per record) Anthony Mcdaniel is an 47 y.o. male who presented to the hospital on 2/25 with AMS and generalized weakness. AKI, hypotension and metabolic acidosis were noted on admission.   Per ICU note: "This is a 47 year old male patient who was diagnosed with influenza on 2/20 at an urgent care. Leading up to that had to have a typical symptoms of cough, fever, weakness and fatigue. Treatment had been supportive in nature, his p.o. intake has been poor, he had been taking his oral antihypertensives and diuretics up until 2/20 however his oral intake had been poor leading up to that. Per his wife the following few days after being diagnosed he remained quite weak staying primarily in bed. On the a.m. of 2/25 he was too weak to get out of bed, he was incoherent, and mumbling only. Because of this EMS was called. In the emergency room he was found to be hypotensive with systolic blood pressure in the 70s-80s, his serum bicarbonate was 18, his BUN was 112 and creatinine 6.54. An venous blood gas was obtained showing a pH of 7.23, and serum bicarbonate 20.1. He was given 4 L of normal saline with improvement in his blood pressure, CT of abdomen pelvis was obtained this was negative for pathology specifically No evidence of regional calculi or hydronephrosis. Because of his acute encephalopathy as well as metabolic derangements and acute kidney injury critical care was asked to admit."  Initial treatment resulted in some improvement in his confusion, but not full recovery. An MRI was therefore obtained, revealing numerous small supratentorial acute infarcts spanning multiple vascular territories. Differential diagnosis includes embolic phenomena, hypotensive episode and vasculopathy. Also noted were mild chronic small vessel ischemic changes, advanced for age.  An EEG obtained on 2/28 was normal.   His PMHx includes uncontrolled HTN.       SUBJECTIVE (INTERVAL HISTORY) His wife is  Present at bedside. The patient remains l  Sleepy but wakes up. As he woke up he was alert and oriented and able to follow commands. There was no focal weakness on exam. Briefly discussed need for TEE. And loop    OBJECTIVE Temp:  [98.4 F (36.9 C)-99.3 F (37.4 C)] 98.5 F (36.9 C) (03/03 1114) Pulse Rate:  [63-77] 63 (03/03 1114) Cardiac Rhythm: Heart block (03/03 0400) Resp:  [18-20] 20 (03/03 1114) BP: (110-148)/(56-79) 148/79 (03/03 1114) SpO2:  [96 %-99 %] 98 % (03/03 1114) Weight:  [255 lb 15.3 oz (116.1 kg)] 255 lb 15.3 oz (116.1 kg) (03/03 1142)  CBC:  Recent Labs  Lab 03/15/17 0442 03/16/17 0453 03/18/17 0357  WBC 5.7 6.9 6.1  NEUTROABS 3.5 4.4  --   HGB 11.7* 10.6* 10.0*  HCT 36.6* 32.8* 30.7*  MCV 84.3 83.0 81.6  PLT 279 315 329    Basic Metabolic Panel:  Recent Labs  Lab 03/15/17 0442 03/16/17 0453 03/18/17 0007 03/18/17 0357  NA 145 143 137 137  K 4.5 4.0 3.8 3.7  CL 106 106 102 104  CO2 27 26 24 22   GLUCOSE 101* 101* 89 87  BUN 25* 25* 16 16  CREATININE 1.71* 1.67* 1.64* 1.60*  CALCIUM 9.5 9.5 8.9 8.9  MG 2.1 1.8  --   --   PHOS 3.3 3.5  --   --     Lipid Panel:     Component Value Date/Time   CHOL 251 (H) 03/19/2015 0034   TRIG 168 (H) 03/19/2015 0034  HDL 35 (L) 03/19/2015 0034   CHOLHDL 7.2 03/19/2015 0034   VLDL 34 03/19/2015 0034   LDLCALC 182 (H) 03/19/2015 0034   HgbA1c:  Lab Results  Component Value Date   HGBA1C 5.7 (H) 03/18/2017   Urine Drug Screen:     Component Value Date/Time   LABOPIA NONE DETECTED 03/12/2017 1337   COCAINSCRNUR NONE DETECTED 03/12/2017 1337   LABBENZ NONE DETECTED 03/12/2017 1337   AMPHETMU NONE DETECTED 03/12/2017 1337   THCU NONE DETECTED 03/12/2017 1337   LABBARB NONE DETECTED 03/12/2017 1337    Alcohol Level No results found for: [facility]  [facility]AG[facility]  Mr Maxine GlennMra Head Wo Contrast 03/16/2017 IMPRESSION:  Negative intracranial MRA.    Mr Brain  Wo Contrast 03/15/2017 IMPRESSION:  1. Motion degraded examination.  2. Numerous small supratentorial acute infarcts spanning multiple vascular territories. Differential diagnosis includes embolic phenomena, hypotensive episode, vasculopathy.  3. Mild chronic small vessel ischemic disease, advanced for age.     Transthoracic Echocardiogram  03/13/2017 Study Conclusions - Left ventricle: The cavity size was normal. Wall thickness was   increased in a pattern of severe LVH. There was severe concentric   hypertrophy. The estimated ejection fraction was in the range of   65% to 70%. Wall motion was normal; there were no regional wall   motion abnormalities. Doppler parameters are consistent with   abnormal left ventricular relaxation (grade 1 diastolic   dysfunction). - Left atrium: The atrium was mildly dilated. - No significant valvular abnormality.   Inadequate TR jet to estimate PASP. Normal right atrial pressure.   TEE - pending 03/16/2017   EEG  03/15/2017 Impression:   This is a normal EEG in the awake and brief sleep states.     Bilateral Carotid Dopplers 03/16/2017 Final Interpretation: Right Carotid: Velocities in the right ICA are consistent with a 1-39% stenosis. Left Carotid: Velocities in the left ICA are consistent with a 40-59% stenosis. Vertebrals: Both vertebral arteries were patent with antegrade flow.  Vitals:   03/17/17 2349 03/18/17 0410 03/18/17 1114 03/18/17 1142  BP: (!) 128/56 (!) 110/59 (!) 148/79   Pulse: 70 74 63   Resp: 18 18 20    Temp: 99.3 F (37.4 C) 99.2 F (37.3 C) 98.5 F (36.9 C)   TempSrc: Oral Oral Oral   SpO2: 99% 96% 98%   Weight:    255 lb 15.3 oz (116.1 kg)  Height:       Pleasant middle-age male currently not in distress. . Afebrile. Head is nontraumatic. Neck is supple without bruit.    Cardiac exam no murmur or gallop. Lungs are clear to auscultation. Distal pulses are well felt. Neurological Exam ;  Awake  Alert oriented x  3. Normal speech and language.eye movements full without nystagmus.fundi were not visualized. Vision acuity and fields appear normal. Hearing is normal. Palatal movements are normal. Face symmetric. Tongue midline. Normal strength, tone, reflexes and coordination. Normal sensation. Gait deferred. ASSESSMENT/PLAN Mr. Shanna CiscoKevin Batte is a 47 y.o. male with history of hypertension and hyperlipidemia presenting with altered mental status, generalized weakness, acute kidney injury, hypotension, and metabolic acidosis. He did not receive IV t-PA due to late presentation.  Stroke: bilateral small infarcts embolic etiology - source unknown vs hypotension.  Resultant  no focal deficits  CT head - No acute intracranial pathology.  MRI head -  Numerous small supratentorial acute infarcts spanning multiple vascular territories.  MRA head - negative  EEG - normal  Carotid Doppler - left ICA - 40-59%  stenosis.  2D Echo - EF 65-70%. Mildly dilated atrium but no cardiac source of emboli identified.  LDL - 182  HgbA1c - will order  VTE prophylaxis - subcutaneous heparin Fall precautions Diet NPO time specified Except for: Sips with Meds Diet regular Room service appropriate? Yes; Fluid consistency: Thin  aspirin 81 mg daily prior to admission, now on aspirin 325 mg daily  Patient counseled to be compliant with his antithrombotic medications  Ongoing aggressive stroke risk factor management  Therapy recommendations:  Home Disposition:  home Hypertension  Stable  Permissive hypertension (OK if < 220/120) but gradually normalize in 5-7 days  Long-term BP goal normotensive  Hyperlipidemia  Home meds:  Lipitor 40 mg daily resumed in hospital  LDL 182, goal < 70  Will increase Lipitor to 80 mg daily  Continue statin at discharge    Other Stroke Risk Factors  Obesity, Body mass index is 33.77 kg/m., recommend weight loss, diet and exercise as appropriate    Other Active  Problems  Mild anemia  Acute kidney injury   Plan / Recommendations     TEE / loop.   Hospital day # 6    I have personally examined this patient, reviewed notes, independently viewed imaging studies, participated in medical decision making and plan of care.ROS completed by me personally and pertinent positives fully documented  I have made any additions or clarifications directly to the above note.  He presented with confusion and encephalopathy in the setting of hypertension but MRI scan shows multiple punctate infarcts in different vascular distributions raising strong suspicion for cardiac embolism. Versus multifocal small vessel disease. Recommend checking  , transesophageal echocardiogram and prolonged cardiac monitoring. Long discussion with patient and his wife and Dr. Cena Benton and answered questions about his care Greater than 50% time during this 25 minute visit was spent on counseling and coordination of care about his strokes and answering questions  Delia Heady, MD Medical Director [facility] Pager: [phone] 03/18/2017 12:32 PM   To contact Stroke Continuity provider, please refer to [email or web address]. After hours, contact General Neurology

## 2017-03-19 ENCOUNTER — Inpatient Hospital Stay (HOSPITAL_COMMUNITY): Payer: 59

## 2017-03-19 ENCOUNTER — Ambulatory Visit (HOSPITAL_COMMUNITY): Payer: 59

## 2017-03-19 ENCOUNTER — Encounter (HOSPITAL_COMMUNITY)
Admission: EM | Disposition: A | Discharge status: Home Health | Payer: Self-pay | Source: Home / Self Care | Attending: Internal Medicine

## 2017-03-19 ENCOUNTER — Encounter (HOSPITAL_COMMUNITY): Payer: Self-pay | Admitting: Cardiology

## 2017-03-19 DIAGNOSIS — Z86718 Personal history of other venous thrombosis and embolism: Secondary | ICD-10-CM

## 2017-03-19 HISTORY — PX: TEE WITHOUT CARDIOVERSION: SHX5443

## 2017-03-19 LAB — BASIC METABOLIC PANEL
ANION GAP: 11 (ref 5–15)
ANION GAP: 11 (ref 5–15)
BUN: 20 mg/dL (ref 6–20)
BUN: 19 mg/dL (ref 6–20)
CALCIUM: 8.9 mg/dL (ref 8.9–10.3)
CALCIUM: 9.2 mg/dL (ref 8.9–10.3)
CO2: 22 mmol/L (ref 22–32)
CO2: 23 mmol/L (ref 22–32)
CREATININE: 1.66 mg/dL — AB (ref 0.61–1.24)
CREATININE: 1.68 mg/dL — AB (ref 0.61–1.24)
Chloride: 106 mmol/L (ref 101–111)
Chloride: 107 mmol/L (ref 101–111)
GFR, EST AFRICAN AMERICAN: 56 mL/min — AB (ref >60)
GFR, EST AFRICAN AMERICAN: 55 mL/min — AB (ref >60)
GFR, EST NON AFRICAN AMERICAN: 48 mL/min — AB (ref >60)
GFR, EST NON AFRICAN AMERICAN: 47 mL/min — AB (ref >60)
Glucose, Bld: 98 mg/dL (ref 65–99)
Glucose, Bld: 94 mg/dL (ref 65–99)
Potassium: 3.7 mmol/L (ref 3.5–5.1)
Potassium: 4 mmol/L (ref 3.5–5.1)
Sodium: 139 mmol/L (ref 135–145)
Sodium: 141 mmol/L (ref 135–145)

## 2017-03-19 LAB — CBC
HCT: 33.4 % — ABNORMAL LOW (ref 39.0–52.0)
HEMOGLOBIN: 10.5 g/dL — AB (ref 13.0–17.0)
MCH: 26.3 pg (ref 26.0–34.0)
MCHC: 31.4 g/dL (ref 30.0–36.0)
MCV: 83.5 fL (ref 78.0–100.0)
PLATELETS: 390 10*3/uL (ref 150–400)
RBC: 4 MIL/uL — AB (ref 4.22–5.81)
RDW: 14.8 % (ref 11.5–15.5)
WBC: 7 10*3/uL (ref 4.0–10.5)

## 2017-03-19 LAB — LIPID PANEL
CHOLESTEROL: 129 mg/dL (ref 0–200)
HDL: 23 mg/dL — AB (ref >40)
LDL Cholesterol: 69 mg/dL (ref 0–99)
TRIGLYCERIDES: 187 mg/dL — AB (ref <150)
Total CHOL/HDL Ratio: 5.6 RATIO
VLDL: 37 mg/dL (ref 0–40)

## 2017-03-19 SURGERY — ECHOCARDIOGRAM, TRANSESOPHAGEAL
Anesthesia: Moderate Sedation

## 2017-03-19 MED ORDER — NEBIVOLOL HCL 5 MG PO TABS: 5.0000 mg | ORAL_TABLET | Freq: Every day | ORAL | 0 refills | Status: DC

## 2017-03-19 MED ORDER — FENTANYL CITRATE (PF) 100 MCG/2ML IJ SOLN
INTRAMUSCULAR | Status: AC
  Filled 2017-03-19: qty 2

## 2017-03-19 MED ORDER — ISOSORBIDE MONONITRATE ER 30 MG PO TB24: 30.0000 mg | ORAL_TABLET | Freq: Every day | ORAL | 0 refills | Status: DC

## 2017-03-19 MED ORDER — SODIUM CHLORIDE 0.9 % IV SOLN
INTRAVENOUS | Status: DC
  Administered 2017-03-19: 10:00:00 via INTRAVENOUS

## 2017-03-19 MED ORDER — ASPIRIN 325 MG PO TBEC: 325.0000 mg | DELAYED_RELEASE_TABLET | Freq: Every day | ORAL | 0 refills | Status: DC

## 2017-03-19 MED ORDER — SODIUM CHLORIDE BACTERIOSTATIC 0.9 % IJ SOLN
INTRAMUSCULAR | Status: DC | PRN
  Administered 2017-03-19: 9 mL

## 2017-03-19 MED ORDER — FENTANYL CITRATE (PF) 100 MCG/2ML IJ SOLN
INTRAMUSCULAR | Status: DC | PRN
  Administered 2017-03-19 (×2): 25 ug via INTRAVENOUS
  Administered 2017-03-19: 50 ug via INTRAVENOUS

## 2017-03-19 MED ORDER — MIDAZOLAM HCL 2 MG/2ML IJ SOLN
INTRAMUSCULAR | Status: DC | PRN
  Administered 2017-03-19: 3 mg via INTRAVENOUS
  Administered 2017-03-19: 1 mg via INTRAVENOUS
  Administered 2017-03-19 (×3): 2 mg via INTRAVENOUS

## 2017-03-19 MED ORDER — ATORVASTATIN CALCIUM 80 MG PO TABS: 80.0000 mg | ORAL_TABLET | Freq: Every day | ORAL | 0 refills | Status: DC

## 2017-03-19 MED ORDER — MIDAZOLAM HCL 5 MG/ML IJ SOLN
INTRAMUSCULAR | Status: AC
  Filled 2017-03-19: qty 2

## 2017-03-19 NOTE — Interval H&P Note (Signed)
History and Physical Interval Note:  03/19/2017 10:37 AM  Anthony Mcdaniel  has presented today for surgery, with the diagnosis of stroke  The various methods of treatment have been discussed with the patient and family. After consideration of risks, benefits and other options for treatment, the patient has consented to  Procedure(s): TRANSESOPHAGEAL ECHOCARDIOGRAM (TEE) (N/A) as a surgical intervention .  The patient's history has been reviewed, patient examined, no change in status, stable for surgery.  I have reviewed the patient's chart and labs.  Questions were answered to the patient's satisfaction.     Manish J Patwardhan

## 2017-03-19 NOTE — Discharge Summary (Signed)
Physician Discharge Summary  Anthony Mcdaniel ZOX:[id number] DOB: May 22, 1970 DOA: 03/12/2017  PCP: Mila Palmer, MD  Admit date: 03/12/2017 Discharge date: 03/19/2017  Time spent: > 35 minutes  Recommendations for Outpatient Follow-up:  1.    Discharge Diagnoses:  Active Problems:   Acute metabolic encephalopathy   Cerebral thrombosis with cerebral infarction   Discharge Condition: stable  Diet recommendation: Heart healthy  Filed Weights   03/18/17 1142 03/19/17 0500 03/19/17 1007  Weight: 116.1 kg (255 lb 15.3 oz) 110 kg (242 lb 8.1 oz) 110 kg (242 lb 8.1 oz)    History of present illness:  Anthony Mcdaniel an 47 y.o.malewho presented to the hospital on 2/25 with AMS and generalized weakness. AKI, hypotension and metabolic acidosis were noted on admission.   Per ICU note: "This is a 47 year old male patient who was diagnosed with influenza on 2/20 at an urgent care. Leading up to that had to have a typical symptoms of cough, fever, weakness and fatigue. Treatment had been supportive in nature, his p.o. intake has been poor, he had been taking his oral antihypertensives and diuretics up until 2/20 however his oral intake had been poor leading up to that. Per his wife the following few days after being diagnosed he remained quite weak staying primarily in bed. On the a.m. of 2/25 he was too weak to get out of bed, he was incoherent, and mumbling only. Because of this EMS was called. In the emergency room he was found to be hypotensive with systolic blood pressure in the 70s-80s, his serum bicarbonate was 18, his BUN was 112 and creatinine 6.54. An venous blood gas was obtained showing a pH of 7.23, and serum bicarbonate 20.1. He was given 4 L of normal saline with improvement in his blood pressure, CT of abdomen pelvis was obtained this was negative for pathology specifically No evidence of regional calculi or hydronephrosis. Because of his acute encephalopathy as well as  metabolic derangements and acute kidney injury critical care was asked to admit."  Initial treatment resulted in some improvement in his confusion, but not full recovery. An MRI was therefore obtained, revealing numerous small supratentorial acute infarcts spanning multiple vascular territories. Differential diagnosis includes embolic phenomena, hypotensive episodeandvasculopathy. Also noted were mild chronic small vessel ischemicchanges, advanced for age.  An EEG obtained on 2/28 was normal.  His PMHx includes uncontrolled HTN.   Hospital Course:  According to notes from neurology who managed main problem while pt hospitalized: ASSESSMENT/PLAN Mr. Anthony Mcdaniel is a 47 y.o. male with history of hypertension and hyperlipidemia presenting with altered mental status, generalized weakness, acute kidney injury, hypotension, and metabolic acidosis. He did not receive IV t-PA due to late presentation.  Stroke: bilateral small infarcts embolic etiology - source unknown vs hypotension.  Resultant  no focal deficits  CT head - No acute intracranial pathology.  MRI head -  Numerous small supratentorial acute infarcts spanning multiple vascular territories.  MRA head - negative  EEG - normal  Carotid Doppler - left ICA - 40-59% stenosis.  2D Echo - EF 65-70%. Mildly dilated atrium but no cardiac source of emboli identified.  LDL - 182  HgbA1c - will order  VTE prophylaxis - subcutaneous heparin  Fall precautions  Diet Heart Room service appropriate? Yes; Fluid consistency: Thin  aspirin 81 mg daily prior to admission, now on aspirin 325 mg daily  Patient counseled to be compliant with his antithrombotic medications  Ongoing aggressive stroke risk factor management  Therapy recommendations:  Home  Disposition:  home Hypertension  Stable              Permissive hypertension (OK if < 220/120) but gradually normalize in 5-7 days              Long-term BP goal  normotensive  Hyperlipidemia  Home meds:  Lipitor 40 mg daily resumed in hospital  LDL 182, goal < 70  Will increase Lipitor to 80 mg daily  Continue statin at discharge    Other Stroke Risk Factors  Obesity, Body mass index is 31.99 kg/m., recommend weight loss, diet and exercise as appropriate    Other Active Problems  Mild anemia  Acute kidney injury   Plan / Recommendations     Lower extremity venous Doppler for DVT. Patient has a ROPE score of 7 points which gives him a 72% chance that the stroke is attributed to PFO with a 6% risk of two-year recurrence of stroke/TIA. He may be considered for PFO closure but may still need at least 30 day heart monitor to rule out A. fib.  Of note no DVT noted on lower extremity doppler as such patient not discharge on anticoagulant  Procedures:  Please refer to last note from neurology 3/4 to see stroke w/u  Consultations:  Neurology  Discharge Exam: Vitals:   03/19/17 1110 03/19/17 1120  BP: (!) 193/92 (!) 202/101  Pulse: 85 82  Resp: (!) 24 18  Temp: 99.2 F (37.3 C)   SpO2: 92% 93%    General: Pt in nad, alert and awake Cardiovascular: rrr, no rubs Respiratory: no increased wob, no wheezes  Discharge Instructions   Discharge Instructions    Ambulatory referral to Occupational Therapy   Complete by:  As directed    [phone] wife's number   Ambulatory referral to Physical Therapy   Complete by:  As directed    Ambulatory referral to Speech Therapy   Complete by:  As directed    [phone] wifes number--may have to leave message and she will call back   Call MD for:  temperature >100.4   Complete by:  As directed    Diet - low sodium heart healthy   Complete by:  As directed    Discharge instructions   Complete by:  As directed    Please follow-up with your cardiologist for 30 day Holter monitoring as well as plans for PFO closure   Increase activity slowly   Complete by:  As  directed      Allergies as of 03/19/2017   No Known Allergies     Medication List    STOP taking these medications   amLODipine 10 MG tablet Commonly known as:  NORVASC   chlorthalidone 25 MG tablet Commonly known as:  HYGROTON   diltiazem 360 MG 24 hr capsule Commonly known as:  CARDIZEM CD   hydrALAZINE 50 MG tablet Commonly known as:  APRESOLINE   hydrochlorothiazide 25 MG tablet Commonly known as:  HYDRODIURIL   losartan 100 MG tablet Commonly known as:  COZAAR   metoprolol tartrate 100 MG tablet Commonly known as:  LOPRESSOR   minoxidil 10 MG tablet Commonly known as:  LONITEN   promethazine-dextromethorphan 6.25-15 MG/5ML syrup Commonly known as:  PROMETHAZINE-DM   spironolactone 50 MG tablet Commonly known as:  ALDACTONE   THERAFLU FLU/COLD PO     TAKE these medications   aspirin 325 MG EC tablet Take 1 tablet (325 mg total) by mouth daily. Start taking on:  03/20/2017 What changed:  medication strength  how much to take   atorvastatin 80 MG tablet Commonly known as:  LIPITOR Take 1 tablet (80 mg total) by mouth daily at 6 PM. What changed:    medication strength  how much to take   fluticasone 50 MCG/ACT nasal spray Commonly known as:  FLONASE Place into both nostrils daily.   isosorbide mononitrate 30 MG 24 hr tablet Commonly known as:  IMDUR Take 1 tablet (30 mg total) by mouth daily. Start taking on:  03/20/2017 What changed:    medication strength  how much to take  Another medication with the same name was removed. Continue taking this medication, and follow the directions you see here.   nebivolol 5 MG tablet Commonly known as:  BYSTOLIC Take 1 tablet (5 mg total) by mouth daily. Start taking on:  03/20/2017 What changed:    medication strength  how much to take            Durable Medical Equipment  (From admission, onward)        Start     Ordered   03/19/17 1023  For home use only DME 3 n 1  Once      03/19/17 1022     No Known Allergies Follow-up Information    [facility] Follow up.   Specialty:  Rehabilitation Why:  They will contact you for the first appointment Contact information: [address] [id number] [place] [address] [phone]       Yates Decamp, MD Follow up on 03/28/2017.   Specialty:  Cardiology Why:  1:00 PM Contact information: [address] [place] [address] [phone]            The results of significant diagnostics from this hospitalization (including imaging, microbiology, ancillary and laboratory) are listed below for reference.    Significant Diagnostic Studies: Ct Head Wo Contrast  Result Date: 03/12/2017 CLINICAL DATA:  Altered level of consciousness (LOC), lethargic EXAM: CT HEAD WITHOUT CONTRAST TECHNIQUE: Contiguous axial images were obtained from the base of the skull through the vertex without intravenous contrast. COMPARISON:  None. FINDINGS: Brain: No evidence of acute infarction, hemorrhage, hydrocephalus, extra-axial collection or mass lesion/mass effect. Periventricular white matter low attenuation as can be seen with microvascular disease. Vascular: No hyperdense vessel or unexpected calcification. Skull: No osseous abnormality. Sinuses/Orbits: Visualized paranasal sinuses are clear. Visualized mastoid sinuses are clear. Visualized orbits demonstrate no focal abnormality. Other: None IMPRESSION: No acute intracranial pathology. Electronically Signed   By: Elige Ko   On: 03/12/2017 12:21   Mr Anthony Mcdaniel Head Wo Contrast  Result Date: 03/16/2017 CLINICAL DATA:  47 year old male with altered mental status and numerous punctate supratentorial acute infarcts on brain MRI yesterday. EXAM: MRA HEAD WITHOUT CONTRAST TECHNIQUE: Angiographic images of the Circle of Willis were obtained using MRA technique without intravenous contrast. COMPARISON:  Brain MRI  03/15/2017.  Noncontrast head CT 03/12/2017. FINDINGS: Study is mildly degraded by motion artifact. There is no intracranial mass effect or ventriculomegaly. Antegrade flow in the posterior circulation with codominant distal vertebral arteries. No distal vertebral stenosis. Both PICA origins are patent. Patent vertebrobasilar junction. Mildly tortuous and irregular basilar artery but no convincing basilar stenosis. SCA and PCA origins are patent and within normal limits, fetal type left PCA origin. The right posterior communicating artery is present. Bilateral PCA branches are within normal limits allowing for motion. Antegrade flow in both ICA siphons. No siphon stenosis. Ophthalmic and posterior communicating  artery origins appear normal. The carotid termini are patent. The left ACA A1 segment is tortuous. The anterior communicating artery and visible ACA branches are within normal limits. Bilateral MCA origins and M1 segments appear normal. Both MCA bifurcations are patent. The visible bilateral MCA branches are within normal limits when allowing for some motion. IMPRESSION: Negative intracranial MRA. Electronically Signed   By: Odessa Fleming M.D.   On: 03/16/2017 12:57   Mr Brain Wo Contrast  Result Date: 03/15/2017 CLINICAL DATA:  Altered level of consciousness. Diagnosed with flu 3 days ago. Suspect stroke. History of hypertension. EXAM: MRI HEAD WITHOUT CONTRAST TECHNIQUE: Multiplanar, multiecho pulse sequences of the brain and surrounding structures were obtained without intravenous contrast. COMPARISON:  CT HEAD March 12, 2017 FINDINGS: Mild motion degraded examination. INTRACRANIAL CONTENTS: Greater than 20 subcentimeter relatively symmetric supratentorial and cortical foci of reduced diffusion including LEFT basal ganglia with low ADC values on identifiable lesions. Pontine microhemorrhage. Patchy supratentorial white matter FLAIR T2 hyperintensities exclusive a aforementioned abnormality. Old LEFT basal  ganglia lacunar infarct. No midline shift, mass effect or definite masses though limited by motion. No abnormal extra-axial fluid collections. No parenchymal brain volume loss for age. No hydrocephalus. VASCULAR: Normal major intracranial vascular flow voids present at skull base. SKULL AND UPPER CERVICAL SPINE: No abnormal sellar expansion. No suspicious calvarial bone marrow signal. Craniocervical junction maintained. SINUSES/ORBITS: The mastoid air-cells and included paranasal sinuses are well-aerated.The included ocular globes and orbital contents are non-suspicious. OTHER: None. IMPRESSION: 1. Motion degraded examination. 2. Numerous small supratentorial acute infarcts spanning multiple vascular territories. Differential diagnosis includes embolic phenomena, hypotensive episode, vasculopathy. 3. Mild chronic small vessel ischemic disease, advanced for age. 4. These results will be called to the ordering clinician or representative by the Radiologist Assistant, and communication documented in the PACS or zVision Dashboard. Electronically Signed   By: Awilda Metro M.D.   On: 03/15/2017 19:49   Dg Chest Port 1 View  Result Date: 03/12/2017 CLINICAL DATA:  Decreased oxygen saturation, altered mental status. EXAM: PORTABLE CHEST 1 VIEW COMPARISON:  Chest x-ray of March 18, 2015 FINDINGS: The lungs are adequately inflated and clear. The heart is mildly enlarged. The pulmonary vascularity is not engorged. The mediastinum is normal in width. There is no pleural effusion. The bony thorax is unremarkable. IMPRESSION: Cardiomegaly, stable. No pulmonary edema, pneumonia, nor other acute cardiopulmonary abnormality. Electronically Signed   By: David  [place] M.D.   On: 03/12/2017 11:54   Ct Renal Stone Study  Result Date: 03/12/2017 CLINICAL DATA:  Lethargic, near syncope, or p.o. intake. No urine output since yesterday. Altered mental status. EXAM: CT ABDOMEN AND PELVIS WITHOUT CONTRAST TECHNIQUE: Multidetector  CT imaging of the abdomen and pelvis was performed following the standard protocol without IV contrast. COMPARISON:  CT abdomen dated 04/26/2016. FINDINGS: Lower chest: No acute abnormality. Hepatobiliary: No focal liver abnormality is seen. No gallstones, gallbladder wall thickening, or biliary dilatation. Pancreas: Unremarkable. No pancreatic ductal dilatation or surrounding inflammatory changes. Spleen: Normal in size without focal abnormality. Adrenals/Urinary Tract: Adrenal glands appear normal. Kidneys are unremarkable without mass, stone or hydronephrosis. No perinephric fluid. No ureteral or bladder calculi identified. Bladder is decompressed. Stomach/Bowel: No dilated large or small bowel loops. No bowel wall thickening or evidence of bowel wall inflammation seen. Stomach appears normal, partially decompressed. Appendix is normal. Vascular/Lymphatic: Mild aortic atherosclerosis. No enlarged lymph nodes seen. Reproductive: Prostate is unremarkable. Suspected peri scrotal varicoceles bilaterally. Other: No free fluid or abscess collection. No free intraperitoneal air.  Musculoskeletal: No acute or suspicious osseous finding. Superficial soft tissues are unremarkable. IMPRESSION: 1. No acute findings. No bowel obstruction or evidence of bowel wall inflammation. No evidence of acute solid organ abnormality. No renal or ureteral calculi seen. No hydronephrosis. 2. Aortic atherosclerosis. 3. Probable bilateral varicoceles. Electronically Signed   By: Bary Richard M.D.   On: 03/12/2017 14:11    Microbiology: Recent Results (from the past 240 hour(s))  Culture, blood (routine x 2)     Status: None   Collection Time: 03/12/17 11:36 AM  Result Value Ref Range Status   Specimen Description   Final    BLOOD RIGHT HAND Performed at [facility], [address] [place], [place] [address]    Special Requests   Final    BOTTLES DRAWN AEROBIC AND ANAEROBIC Blood Culture adequate  volume Performed at [facility], [address] [place], [place] [address]    Culture   Final    NO GROWTH 5 DAYS Performed at [facility], [address], [place], [place] [address]    Report Status 03/17/2017 FINAL  Final  Culture, blood (routine x 2)     Status: None   Collection Time: 03/12/17 12:56 PM  Result Value Ref Range Status   Specimen Description   Final    BLOOD RIGHT ANTECUBITAL Performed at [facility], [address], [place], [place] [address]    Special Requests   Final    BOTTLES DRAWN AEROBIC ONLY Blood Culture adequate volume Performed at [facility], [address], [place], [place] [address]    Culture   Final    NO GROWTH 5 DAYS Performed at [facility], [address], [place], [place] [address]    Report Status 03/17/2017 FINAL  Final  MRSA PCR Screening     Status: None   Collection Time: 03/12/17  5:57 PM  Result Value Ref Range Status   MRSA by PCR NEGATIVE NEGATIVE Final    Comment:        The GeneXpert MRSA Assay (FDA approved for NASAL specimens only), is one component of a comprehensive MRSA colonization surveillance program. It is not intended to diagnose MRSA infection nor to guide or monitor treatment for MRSA infections. Performed at [facility], [address], [place], [place] [address]      Labs: Basic Metabolic Panel: Recent Labs  Lab 03/13/17 0030  03/13/17 1747  03/14/17 9629 03/15/17 5284 03/16/17 1324 03/18/17 0007 03/18/17 0357 03/19/17 0007 03/19/17 0846  NA 145   < > 151*   < >  --  145 143 137 137 139 141  K 3.8   < > 4.2   < >  --  4.5 4.0 3.8 3.7 3.7 4.0  CL 109   < > 109   < >  --  106 106 102 104 106 107  CO2 24   < > 32   < >  --  27 26 24 22 22 23   GLUCOSE 129*   < > 114*   < >  --  101* 101* 89 87 98 94  BUN 77*   < > 47*   < >  --  25* 25* 16 16 20 19   CREATININE 3.92*   < > 2.43*   < >  --  1.71* 1.67*  1.64* 1.60* 1.66* 1.68*  CALCIUM 8.3*   < > 9.0   < >  --  9.5 9.5 8.9 8.9 8.9 9.2  MG 1.9  --  2.1  --  1.9 2.1 1.8  --   --   --   --   PHOS 3.3  --  2.7  --  2.5 3.3 3.5  --   --   --   --    < > = values in this interval not displayed.   Liver Function Tests: Recent Labs  Lab 03/14/17 0259 03/16/17 0453  AST 98* 47*  ALT 102* 83*  ALKPHOS 59 69  BILITOT 1.1 1.0  PROT 7.3 7.0  ALBUMIN 4.0 3.9   No results for input(s): LIPASE, AMYLASE in the last 168 hours. Recent Labs  Lab 03/15/17 1012  AMMONIA 28   CBC: Recent Labs  Lab 03/14/17 0259 03/15/17 0442 03/16/17 0453 03/18/17 0357 03/19/17 0846  WBC 5.7 5.7 6.9 6.1 7.0  NEUTROABS 3.9 3.5 4.4  --   --   HGB 10.4* 11.7* 10.6* 10.0* 10.5*  HCT 32.9* 36.6* 32.8* 30.7* 33.4*  MCV 85.0 84.3 83.0 81.6 83.5  PLT 225 279 315 329 390   Cardiac Enzymes: Recent Labs  Lab 03/14/17 0259 03/14/17 0713 03/15/17 0756 03/15/17 1338 03/15/17 1814  CKTOTAL  --   --  197  --   --   CKMB  --   --  3.3  --   --   TROPONINI 2.64* 2.64* 2.77* 2.58* 2.25*   BNP: BNP (last 3 results) No results for input(s): BNP in the last 8760 hours.  ProBNP (last 3 results) No results for input(s): PROBNP in the last 8760 hours.  CBG: No results for input(s): GLUCAP in the last 168 hours.   Signed:  Penny Piarlando Michi Herrmann MD.  [facility] 03/19/2017, 5:22 PM

## 2017-03-19 NOTE — Progress Notes (Signed)
  Echocardiogram Echocardiogram Transesophageal has been performed.  [name], Anthony Mcdaniel R 03/19/2017, 11:10 AM

## 2017-03-19 NOTE — Progress Notes (Signed)
STROKE TEAM PROGRESS NOTE   HISTORY OF PRESENT ILLNESS (per record) Hakan Nudelman is an 47 y.o. male who presented to the hospital on 2/25 with AMS and generalized weakness. AKI, hypotension and metabolic acidosis were noted on admission.   Per ICU note: "This is a 47 year old male patient who was diagnosed with influenza on 2/20 at an urgent care. Leading up to that had to have a typical symptoms of cough, fever, weakness and fatigue. Treatment had been supportive in nature, his p.o. intake has been poor, he had been taking his oral antihypertensives and diuretics up until 2/20 however his oral intake had been poor leading up to that. Per his wife the following few days after being diagnosed he remained quite weak staying primarily in bed. On the a.m. of 2/25 he was too weak to get out of bed, he was incoherent, and mumbling only. Because of this EMS was called. In the emergency room he was found to be hypotensive with systolic blood pressure in the 70s-80s, his serum bicarbonate was 18, his BUN was 112 and creatinine 6.54. An venous blood gas was obtained showing a pH of 7.23, and serum bicarbonate 20.1. He was given 4 L of normal saline with improvement in his blood pressure, CT of abdomen pelvis was obtained this was negative for pathology specifically No evidence of regional calculi or hydronephrosis. Because of his acute encephalopathy as well as metabolic derangements and acute kidney injury critical care was asked to admit."  Initial treatment resulted in some improvement in his confusion, but not full recovery. An MRI was therefore obtained, revealing numerous small supratentorial acute infarcts spanning multiple vascular territories. Differential diagnosis includes embolic phenomena, hypotensive episode and vasculopathy. Also noted were mild chronic small vessel ischemic changes, advanced for age.  An EEG obtained on 2/28 was normal.   His PMHx includes uncontrolled HTN.       SUBJECTIVE (INTERVAL HISTORY) His wife is  Present at bedside. The patient  Had TEE which showed a PFO and LE venous doppler is pending   OBJECTIVE Temp:  [98.3 F (36.8 C)-99.2 F (37.3 C)] 99.2 F (37.3 C) (03/04 1110) Pulse Rate:  [64-128] 82 (03/04 1120) Cardiac Rhythm: Heart block (03/04 0700) Resp:  [12-24] 18 (03/04 1120) BP: (116-202)/(63-116) 202/101 (03/04 1120) SpO2:  [92 %-98 %] 93 % (03/04 1120) Weight:  [242 lb 8.1 oz (110 kg)] 242 lb 8.1 oz (110 kg) (03/04 1007)  CBC:  Recent Labs  Lab 03/15/17 0442 03/16/17 0453 03/18/17 0357 03/19/17 0846  WBC 5.7 6.9 6.1 7.0  NEUTROABS 3.5 4.4  --   --   HGB 11.7* 10.6* 10.0* 10.5*  HCT 36.6* 32.8* 30.7* 33.4*  MCV 84.3 83.0 81.6 83.5  PLT 279 315 329 390    Basic Metabolic Panel:  Recent Labs  Lab 03/15/17 0442 03/16/17 0453  03/19/17 0007 03/19/17 0846  NA 145 143   < > 139 141  K 4.5 4.0   < > 3.7 4.0  CL 106 106   < > 106 107  CO2 27 26   < > 22 23  GLUCOSE 101* 101*   < > 98 94  BUN 25* 25*   < > 20 19  CREATININE 1.71* 1.67*   < > 1.66* 1.68*  CALCIUM 9.5 9.5   < > 8.9 9.2  MG 2.1 1.8  --   --   --   PHOS 3.3 3.5  --   --   --    < > =  values in this interval not displayed.    Lipid Panel:     Component Value Date/Time   CHOL 251 (H) 03/19/2015 0034   TRIG 168 (H) 03/19/2015 0034   HDL 35 (L) 03/19/2015 0034   CHOLHDL 7.2 03/19/2015 0034   VLDL 34 03/19/2015 0034   LDLCALC 182 (H) 03/19/2015 0034   HgbA1c:  Lab Results  Component Value Date   HGBA1C 5.7 (H) 03/18/2017   Urine Drug Screen:     Component Value Date/Time   LABOPIA NONE DETECTED 03/12/2017 1337   COCAINSCRNUR NONE DETECTED 03/12/2017 1337   LABBENZ NONE DETECTED 03/12/2017 1337   AMPHETMU NONE DETECTED 03/12/2017 1337   THCU NONE DETECTED 03/12/2017 1337   LABBARB NONE DETECTED 03/12/2017 1337    Alcohol Level No results found for: [facility]  Mr Maxine GlennMra Head Wo Contrast 03/16/2017 IMPRESSION:  Negative  intracranial MRA.    Mr Brain Wo Contrast 03/15/2017 IMPRESSION:  1. Motion degraded examination.  2. Numerous small supratentorial acute infarcts spanning multiple vascular territories. Differential diagnosis includes embolic phenomena, hypotensive episode, vasculopathy.  3. Mild chronic small vessel ischemic disease, advanced for age.     Transthoracic Echocardiogram  03/13/2017 Study Conclusions - Left ventricle: The cavity size was normal. Wall thickness was   increased in a pattern of severe LVH. There was severe concentric   hypertrophy. The estimated ejection fraction was in the range of   65% to 70%. Wall motion was normal; there were no regional wall   motion abnormalities. Doppler parameters are consistent with   abnormal left ventricular relaxation (grade 1 diastolic   dysfunction). - Left atrium: The atrium was mildly dilated. - No significant valvular abnormality.   Inadequate TR jet to estimate PASP. Normal right atrial pressure.   TEE - PFO noted 03/19/2017   EEG  03/15/2017 Impression:   This is a normal EEG in the awake and brief sleep states.     Bilateral Carotid Dopplers 03/16/2017 Final Interpretation: Right Carotid: Velocities in the right ICA are consistent with a 1-39% stenosis. Left Carotid: Velocities in the left ICA are consistent with a 40-59% stenosis. Vertebrals: Both vertebral arteries were patent with antegrade flow.  Vitals:   03/19/17 1055 03/19/17 1100 03/19/17 1110 03/19/17 1120  BP:  (!) 177/116 (!) 193/92 (!) 202/101  Pulse: (!) 128 87 85 82  Resp: (!) 22 (!) 22 (!) 24 18  Temp:   99.2 F (37.3 C)   TempSrc:   Oral   SpO2: 96% 98% 92% 93%  Weight:      Height:       Pleasant middle-age male currently not in distress. . Afebrile. Head is nontraumatic. Neck is supple without bruit.    Cardiac exam no murmur or gallop. Lungs are clear to auscultation. Distal pulses are well felt. Neurological Exam ;  Awake  Alert oriented x 3.  Normal speech and language.eye movements full without nystagmus.fundi were not visualized. Vision acuity and fields appear normal. Hearing is normal. Palatal movements are normal. Face symmetric. Tongue midline. Normal strength, tone, reflexes and coordination. Normal sensation. Gait deferred. ASSESSMENT/PLAN Mr. Shanna CiscoKevin Offerdahl is a 47 y.o. male with history of hypertension and hyperlipidemia presenting with altered mental status, generalized weakness, acute kidney injury, hypotension, and metabolic acidosis. He did not receive IV t-PA due to late presentation.  Stroke: bilateral small infarcts embolic etiology - source unknown vs hypotension.  Resultant  no focal deficits  CT head - No acute intracranial pathology.  MRI head -  Numerous small supratentorial acute infarcts spanning multiple vascular territories.  MRA head - negative  EEG - normal  Carotid Doppler - left ICA - 40-59% stenosis.  2D Echo - EF 65-70%. Mildly dilated atrium but no cardiac source of emboli identified.  LDL - 182  HgbA1c - will order  VTE prophylaxis - subcutaneous heparin Fall precautions Diet Heart Room service appropriate? Yes; Fluid consistency: Thin  aspirin 81 mg daily prior to admission, now on aspirin 325 mg daily  Patient counseled to be compliant with his antithrombotic medications  Ongoing aggressive stroke risk factor management  Therapy recommendations:  Home Disposition:  home Hypertension  Stable  Permissive hypertension (OK if < 220/120) but gradually normalize in 5-7 days  Long-term BP goal normotensive  Hyperlipidemia  Home meds:  Lipitor 40 mg daily resumed in hospital  LDL 182, goal < 70  Will increase Lipitor to 80 mg daily  Continue statin at discharge    Other Stroke Risk Factors  Obesity, Body mass index is 31.99 kg/m., recommend weight loss, diet and exercise as appropriate    Other Active Problems  Mild anemia  Acute kidney injury   Plan /  Recommendations     Lower extremity venous Doppler for DVT. Patient has a ROPE score of 7 points which gives him a 72% chance that the stroke is attributed to PFO with a 6% risk of two-year recurrence of stroke/TIA. He may be considered for PFO closure but may still need at least 30 day heart monitor to rule out A. fib.   Hospital day # 7    I have personally examined this patient, reviewed notes, independently viewed imaging studies, participated in medical decision making and plan of care.ROS completed by me personally and pertinent positives fully documented  I have made any additions or clarifications directly to the above note.  He presented with confusion and encephalopathy in the setting of hypertension but MRI scan shows multiple punctate infarcts in different vascular distributions raising strong suspicion for cardiac embolism. Marland Kitchen He has so far not demonstrated atrial fibrillation in greater than 48 hours of continues cardiac monitoring in the hospital. Recommend checking   prolonged cardiac monitoring. Long discussion with patient and his wife and Dr. Cena Benton and answered questions about his care Greater than 50% time during this 25 minute visit was spent on counseling and coordination of care about his strokes and answering questions.he may consider particpation in [facility] registry if interested.  Delia Heady, MD Medical Director [facility] Pager: [phone] 03/19/2017 3:04 PM   To contact Stroke Continuity provider, please refer to [email or web address]. After hours, contact General Neurology

## 2017-03-19 NOTE — Progress Notes (Signed)
OT Cancellation    03/19/17 1000  OT Visit Information  Last OT Received On 03/19/17  Reason Eval/Treat Not Completed Patient at procedure or test/ unavailable (Off the floor. Will return as schedule allows.)   Pierce Barocio MSOT, OTR/L Acute Rehab Pager: [phone] Office: [phone]

## 2017-03-19 NOTE — Progress Notes (Signed)
Physical Therapy Treatment Patient Details Name: Anthony Mcdaniel MRN: [id number] DOB: 09-04-1970 Today's Date: 03/19/2017    History of Present Illness Pt with PMH of uncontrolled hypertension; admitted on 03/12/2017, presented with complaint of confusion, was found to have acute encephalopathy, acute kidney injury as well as metabolic acidosis, recent flu with generalized weakness priro to admission; MRI on 03/15/17 revealed numerous small supratentorial acute infarcts spanning multiple vascular territories. TEE 3/4.    PT Comments    Patient progressing well towards PT goals. Tolerated gait training, stair training and balance training with supervision for safety. Scored 20/24 on DGI indicating pt not an increased risk for falls however still concerned about pt's ability to multi task and be in busy working environment as pt tends to fatigue and weakness/balance deficits more pronounced when distracted or problem solving. Most issues seem to be cognitive as evidence during today's session with some mild balance deficits as well. 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 Recommend OPPT for these higher level tasks. Will continue to follow.   Follow Up Recommendations  Outpatient PT;Supervision - Intermittent     Equipment Recommendations  None recommended by PT    Recommendations for Other Services       Precautions / Restrictions Precautions Precautions: Fall Restrictions Weight Bearing Restrictions: No    Mobility  Bed Mobility Overal bed mobility: Needs Assistance Bed Mobility: Supine to Sit     Supine to sit: Modified independent (Device/Increase time);HOB elevated     General bed mobility comments: No assist needed.   Transfers Overall transfer level: Needs assistance Equipment used: None Transfers: Sit to/from Stand Sit to Stand: Supervision         General transfer  comment: Supervision for safety.   Ambulation/Gait Ambulation/Gait assistance: Supervision Ambulation Distance (Feet): 300 Feet Assistive device: None Gait Pattern/deviations: Step-through pattern;Decreased stride length;Drifts right/left   Gait velocity interpretation: Below normal speed for age/gender General Gait Details: Slow, mildly unsteady gait with drifting noted but no overt LOB. See balance section for details. Difficulty with multi tasking- naming tasks affecting balance.   Stairs Stairs: Yes   Stair Management: Alternating pattern;Two rails Number of Stairs: 3(+ 2 steps x2 bouts) General stair comments: Cues for technique and safety.   Wheelchair Mobility    Modified Rankin (Stroke Patients Only) Modified Rankin (Stroke Patients Only) Pre-Morbid Rankin Score: No symptoms Modified Rankin: Slight disability     Balance Overall balance assessment: Needs assistance Sitting-balance support: Feet supported;No upper extremity supported Sitting balance-Leahy Scale: Good     Standing balance support: During functional activity Standing balance-Leahy Scale: Good               High level balance activites: Backward walking;Direction changes;Sudden stops;Head turns;Turns High Level Balance Comments: Tolerated above with dizziness with vertical head turns and some drifting but no overt LOB. Able to walk backwards and change directions. Standardized Balance Assessment Standardized Balance Assessment : Dynamic Gait Index   Dynamic Gait Index Level Surface: Mild Impairment Change in Gait Speed: Normal Gait with Horizontal Head Turns: Mild Impairment Gait with Vertical Head Turns: Normal Gait and Pivot Turn: Normal Step Over Obstacle: Mild Impairment Step Around Obstacles: Normal Steps: Mild Impairment Total Score: 20      Cognition Arousal/Alertness: Awake/alert Behavior During Therapy: Flat affect Overall Cognitive Status: Impaired/Different from  baseline Area of Impairment: Awareness;Problem solving;Following commands  Following Commands: Follows multi-step commands consistently   Awareness: Emergent Problem Solving: Slow processing General Comments: Difficulty with muli-tasking esp during ambulation; not able to name 5 desserts at all, started naming oatmeal, eggs looking at the trays in 3 mins. Difficulty counting down from 100 by 5s resulting in decreased balance/slower gait.      Exercises      General Comments        Pertinent Vitals/Pain Pain Assessment: No/denies pain    Home Living                      Prior Function            PT Goals (current goals can now be found in the care plan section) Progress towards PT goals: Progressing toward goals    Frequency    Min 3X/week      PT Plan Discharge plan needs to be updated    Co-evaluation              AM-PAC PT "6 Clicks" Daily Activity  Outcome Measure  Difficulty turning over in bed (including adjusting bedclothes, sheets and blankets)?: None Difficulty moving from lying on back to sitting on the side of the bed? : None Difficulty sitting down on and standing up from a chair with arms (e.g., wheelchair, bedside commode, etc,.)?: None Help needed moving to and from a bed to chair (including a wheelchair)?: None Help needed walking in hospital room?: A Little Help needed climbing 3-5 steps with a railing? : A Little 6 Click Score: 22    End of Session Equipment Utilized During Treatment: Gait belt Activity Tolerance: Patient tolerated treatment well Patient left: in bed;with call bell/phone within reach;with family/visitor present Nurse Communication: Mobility status PT Visit Diagnosis: Other abnormalities of gait and mobility (R26.89);Other symptoms and signs involving the nervous system (Z61.096(R29.898)     Time: [id number] PT Time Calculation (min) (ACUTE ONLY): 16 min  Charges:  $Neuromuscular  Re-education: 8-22 mins                    G Codes:       Mylo RedShauna Sophiah Rolin, PT, DPT [phone]     Blake DivineShauna A Darrel Gloss 03/19/2017, 11:12 AM

## 2017-03-19 NOTE — Consult Note (Addendum)
ELECTROPHYSIOLOGY CONSULT NOTE  Patient ID: Anthony Mcdaniel MRN: [id number], DOB/AGE: 47/03/1970   Admit date: 03/12/2017 Date of Consult: 03/19/2017  Primary Physician: Anthony Palmer, MD Primary Cardiologist: Dr. Jacinto Mcdaniel Reason for Consultation: Cryptogenic stroke ; recommendations regarding Implantable Loop Recorder, requested by Dr. Pearlean Mcdaniel  History of Present Illness Anthony Mcdaniel was admitted on 03/12/2017 with AMS, initially found hypotensive initially requiring pressor support >> hypertensive, with abnormal Trop and planned for cardiac cath, though found with acute stroke and cardiac cath cancelled and patient brought to [facility] for further management. PMHx noted for HTN, HLD, morbid obesity.  Recently had a bout of influenza 2/20/1.  Admitted to [facility] 03/12/17 with weakness. They first developed symptoms while at home.  Imaging demonstrated bilateral small infarcts embolic etiology - source unknown vs hypotensionhe has undergone workup for stroke including echocardiogram and Carotid Doppler - left ICA - 40-59% stenosis .  The patient has been monitored on telemetry which has demonstrated sinus rhythm with no arrhythmias.  Inpatient stroke work-up is to be completed with a TEE.   H&P mentions there was question of AF on telemetry though reports at the time of their visit was in SR.    Echocardiogram this admission demonstrated  Study Conclusions - Left ventricle: The cavity size was normal. Wall thickness was   increased in a pattern of severe LVH. There was severe concentric   hypertrophy. The estimated ejection fraction was in the range of   65% to 70%. Wall motion was normal; there were no regional wall   motion abnormalities. Doppler parameters are consistent with   abnormal left ventricular relaxation (grade 1 diastolic   dysfunction). - Left atrium: The atrium was mildly dilated. - No significant valvular abnormality.   Inadequate TR jet to estimate PASP. Normal right atrial  pressure.   Lab work is reviewed.  Prior to admission, the patient denies chest pain, shortness of breath, dizziness, palpitations, or syncope.  He is recovering from his stroke with plans to home at discharge.    Past Medical History:  Diagnosis Date  . Hypertension      Surgical History: History reviewed. No pertinent surgical history.   Medications Prior to Admission  Medication Sig Dispense Refill Last Dose  . amLODipine (NORVASC) 10 MG tablet Take 1 tablet (10 mg total) by mouth daily. 30 tablet 3 Past Week at Unknown time  . aspirin EC 81 MG tablet Take 81 mg by mouth daily.   Past Week at Unknown time  . atorvastatin (LIPITOR) 40 MG tablet Take 1 tablet (40 mg total) by mouth daily at 6 PM. 30 tablet 3 Past Week at Unknown time  . BYSTOLIC 20 MG TABS Take 20 mg by mouth daily.  5 Past Week at Unknown time  . Chlorphen-Pseudoephed-APAP (THERAFLU FLU/COLD PO) Take 15 mLs by mouth 2 (two) times daily as needed (COUGH/FLU).   Past Week at Unknown time  . chlorthalidone (HYGROTON) 25 MG tablet Take 1 tablet (25 mg total) by mouth daily. 30 tablet 3 Past Week at Unknown time  . diltiazem (CARDIZEM CD) 360 MG 24 hr capsule Take 360 mg by mouth daily.  5 Past Week at Unknown time  . fluticasone (FLONASE) 50 MCG/ACT nasal spray Place into both nostrils daily.   03/12/2017 at Unknown time  . hydrALAZINE (APRESOLINE) 50 MG tablet Take 50 mg by mouth 3 (three) times daily.  2 Past Week at Unknown time  . hydrochlorothiazide (HYDRODIURIL) 25 MG tablet Take 25 mg by mouth daily.  Past Week at Unknown time  . isosorbide mononitrate (IMDUR) 60 MG 24 hr tablet Take 60 mg by mouth daily.   Past Week at Unknown time  . losartan (COZAAR) 100 MG tablet Take 100 mg by mouth daily.  2 Past Week at Unknown time  . minoxidil (LONITEN) 10 MG tablet Take 10 mg by mouth daily.  5 Past Week at Unknown time  . promethazine-dextromethorphan (PROMETHAZINE-DM) 6.25-15 MG/5ML syrup Take 5 mLs by mouth every 6  (six) hours as needed for cough.   Past Week at Unknown time  . isosorbide mononitrate (IMDUR) 120 MG 24 hr tablet Take 1 tablet (120 mg total) by mouth daily. (Patient not taking: Reported on 03/12/2017) 30 tablet 3 Not Taking at Unknown time  . metoprolol (LOPRESSOR) 100 MG tablet Take 1 tablet (100 mg total) by mouth 2 (two) times daily. (Patient not taking: Reported on 03/12/2017) 30 tablet 3 Not Taking at Unknown time  . spironolactone (ALDACTONE) 50 MG tablet Take 1 tablet (50 mg total) by mouth daily. (Patient not taking: Reported on 03/12/2017) 30 tablet 3 Not Taking at Unknown time    Inpatient Medications:  .  stroke: mapping our early stages of recovery book   Does not apply Once  . aspirin EC  325 mg Oral Daily  . atorvastatin  80 mg Oral q1800  . heparin  5,000 Units Subcutaneous Q8H  . isosorbide mononitrate  30 mg Oral Daily  . nebivolol  5 mg Oral Daily  . sodium chloride flush  3 mL Intravenous Q12H    Allergies: No Known Allergies  Social History   Socioeconomic History  . Marital status: Married    Spouse name: Not on file  . Number of children: Not on file  . Years of education: Not on file  . Highest education level: Not on file  Social Needs  . Financial resource strain: Not on file  . Food insecurity - worry: Not on file  . Food insecurity - inability: Not on file  . Transportation needs - medical: Not on file  . Transportation needs - non-medical: Not on file  Occupational History  . Not on file  Tobacco Use  . Smoking status: Never Smoker  . Smokeless tobacco: Never Used  Substance and Sexual Activity  . Alcohol use: No  . Drug use: No  . Sexual activity: Not on file  Other Topics Concern  . Not on file  Social History Narrative  . Not on file     Family: No known cardiac family hx   Review of Systems: All other systems reviewed and are otherwise negative except as noted above.  Physical Exam: Vitals:   03/18/17 1858 03/18/17 2300 03/19/17  0410 03/19/17 0500  BP: 126/72 116/63 125/70   Pulse: 67 67 64   Resp: 20 20 20    Temp: 99.1 F (37.3 C) 98.9 F (37.2 C) 98.4 F (36.9 C)   TempSrc: Oral Oral Oral   SpO2: 96% 94% 96%   Weight:    242 lb 8.1 oz (110 kg)  Height:        GEN- The patient is well appearing, alert and oriented x 3 today.   Head- normocephalic, atraumatic Eyes-  Sclera clear, conjunctiva pink Ears- hearing intact Oropharynx- clear Neck- supple Lungs- CTA b/l, normal work of breathing Heart- RRR, no murmurs, rubs or gallops  GI- soft, NT, ND Extremities- no clubbing, cyanosis, or edema MS- no significant deformity or atrophy Skin- no rash or lesion  Psych- euthymic mood, full affect   Labs:   Lab Results  Component Value Date   WBC 6.1 03/18/2017   HGB 10.0 (L) 03/18/2017   HCT 30.7 (L) 03/18/2017   MCV 81.6 03/18/2017   PLT 329 03/18/2017    Recent Labs  Lab 03/16/17 0453  03/19/17 0007  NA 143   < > 139  K 4.0   < > 3.7  CL 106   < > 106  CO2 26   < > 22  BUN 25*   < > 20  CREATININE 1.67*   < > 1.66*  CALCIUM 9.5   < > 8.9  PROT 7.0  --   --   BILITOT 1.0  --   --   ALKPHOS 69  --   --   ALT 83*  --   --   AST 47*  --   --   GLUCOSE 101*   < > 98   < > = values in this interval not displayed.   Lab Results  Component Value Date   CKTOTAL 197 03/15/2017   CKMB 3.3 03/15/2017   TROPONINI 2.25 (HH) 03/15/2017   Lab Results  Component Value Date   CHOL 251 (H) 03/19/2015   Lab Results  Component Value Date   HDL 35 (L) 03/19/2015   Lab Results  Component Value Date   LDLCALC 182 (H) 03/19/2015   Lab Results  Component Value Date   TRIG 168 (H) 03/19/2015   Lab Results  Component Value Date   CHOLHDL 7.2 03/19/2015   No results found for: LDLDIRECT  No results found for: DDIMER   Radiology/Studies:  Ct Head Wo Contrast Result Date: 03/12/2017 CLINICAL DATA:  Altered level of consciousness (LOC), lethargic EXAM: CT HEAD WITHOUT CONTRAST TECHNIQUE:  Contiguous axial images were obtained from the base of the skull through the vertex without intravenous contrast. COMPARISON:  None. FINDINGS: Brain: No evidence of acute infarction, hemorrhage, hydrocephalus, extra-axial collection or mass lesion/mass effect. Periventricular white matter low attenuation as can be seen with microvascular disease. Vascular: No hyperdense vessel or unexpected calcification. Skull: No osseous abnormality. Sinuses/Orbits: Visualized paranasal sinuses are clear. Visualized mastoid sinuses are clear. Visualized orbits demonstrate no focal abnormality. Other: None IMPRESSION: No acute intracranial pathology. Electronically Signed   By: Elige KoHetal  Patel   On: 03/12/2017 12:21    Mr Maxine GlennMra Head Wo Contrast Result Date: 03/16/2017 CLINICAL DATA:  47 year old male with altered mental status and numerous punctate supratentorial acute infarcts on brain MRI yesterday. EXAM: MRA HEAD WITHOUT CONTRAST TECHNIQUE: Angiographic images of the Circle of Willis were obtained using MRA technique without intravenous contrast. COMPARISON:  Brain MRI 03/15/2017.  Noncontrast head CT 03/12/2017. FINDINGS: Study is mildly degraded by motion artifact. There is no intracranial mass effect or ventriculomegaly. Antegrade flow in the posterior circulation with codominant distal vertebral arteries. No distal vertebral stenosis. Both PICA origins are patent. Patent vertebrobasilar junction. Mildly tortuous and irregular basilar artery but no convincing basilar stenosis. SCA and PCA origins are patent and within normal limits, fetal type left PCA origin. The right posterior communicating artery is present. Bilateral PCA branches are within normal limits allowing for motion. Antegrade flow in both ICA siphons. No siphon stenosis. Ophthalmic and posterior communicating artery origins appear normal. The carotid termini are patent. The left ACA A1 segment is tortuous. The anterior communicating artery and visible ACA branches  are within normal limits. Bilateral MCA origins and M1 segments appear normal. Both MCA bifurcations are  patent. The visible bilateral MCA branches are within normal limits when allowing for some motion. IMPRESSION: Negative intracranial MRA. Electronically Signed   By: Odessa Fleming M.D.   On: 03/16/2017 12:57   Mr Brain Wo Contrast Result Date: 03/15/2017 CLINICAL DATA:  Altered level of consciousness. Diagnosed with flu 3 days ago. Suspect stroke. History of hypertension. EXAM: MRI HEAD WITHOUT CONTRAST TECHNIQUE: Multiplanar, multiecho pulse sequences of the brain and surrounding structures were obtained without intravenous contrast. COMPARISON:  CT HEAD March 12, 2017 FINDINGS: Mild motion degraded examination. INTRACRANIAL CONTENTS: Greater than 20 subcentimeter relatively symmetric supratentorial and cortical foci of reduced diffusion including LEFT basal ganglia with low ADC values on identifiable lesions. Pontine microhemorrhage. Patchy supratentorial white matter FLAIR T2 hyperintensities exclusive a aforementioned abnormality. Old LEFT basal ganglia lacunar infarct. No midline shift, mass effect or definite masses though limited by motion. No abnormal extra-axial fluid collections. No parenchymal brain volume loss for age. No hydrocephalus. VASCULAR: Normal major intracranial vascular flow voids present at skull base. SKULL AND UPPER CERVICAL SPINE: No abnormal sellar expansion. No suspicious calvarial bone marrow signal. Craniocervical junction maintained. SINUSES/ORBITS: The mastoid air-cells and included paranasal sinuses are well-aerated.The included ocular globes and orbital contents are non-suspicious. OTHER: None. IMPRESSION: 1. Motion degraded examination. 2. Numerous small supratentorial acute infarcts spanning multiple vascular territories. Differential diagnosis includes embolic phenomena, hypotensive episode, vasculopathy. 3. Mild chronic small vessel ischemic disease, advanced for age. 4.  These results will be called to the ordering clinician or representative by the Radiologist Assistant, and communication documented in the PACS or zVision Dashboard. Electronically Signed   By: Awilda Metro M.D.   On: 03/15/2017 19:49   Dg Chest Port 1 View Result Date: 03/12/2017 CLINICAL DATA:  Decreased oxygen saturation, altered mental status. EXAM: PORTABLE CHEST 1 VIEW COMPARISON:  Chest x-ray of March 18, 2015 FINDINGS: The lungs are adequately inflated and clear. The heart is mildly enlarged. The pulmonary vascularity is not engorged. The mediastinum is normal in width. There is no pleural effusion. The bony thorax is unremarkable. IMPRESSION: Cardiomegaly, stable. No pulmonary edema, pneumonia, nor other acute cardiopulmonary abnormality. Electronically Signed   By: David  [place]and M.D.   On: 03/12/2017 11:54   Ct Renal Stone Study Result Date: 03/12/2017 CLINICAL DATA:  Lethargic, near syncope, or p.o. intake. No urine output since yesterday. Altered mental status. EXAM: CT ABDOMEN AND PELVIS WITHOUT CONTRAST TECHNIQUE: Multidetector CT imaging of the abdomen and pelvis was performed following the standard protocol without IV contrast. COMPARISON:  CT abdomen dated 04/26/2016. FINDINGS: Lower chest: No acute abnormality. Hepatobiliary: No focal liver abnormality is seen. No gallstones, gallbladder wall thickening, or biliary dilatation. Pancreas: Unremarkable. No pancreatic ductal dilatation or surrounding inflammatory changes. Spleen: Normal in size without focal abnormality. Adrenals/Urinary Tract: Adrenal glands appear normal. Kidneys are unremarkable without mass, stone or hydronephrosis. No perinephric fluid. No ureteral or bladder calculi identified. Bladder is decompressed. Stomach/Bowel: No dilated large or small bowel loops. No bowel wall thickening or evidence of bowel wall inflammation seen. Stomach appears normal, partially decompressed. Appendix is normal. Vascular/Lymphatic: Mild  aortic atherosclerosis. No enlarged lymph nodes seen. Reproductive: Prostate is unremarkable. Suspected peri scrotal varicoceles bilaterally. Other: No free fluid or abscess collection. No free intraperitoneal air. Musculoskeletal: No acute or suspicious osseous finding. Superficial soft tissues are unremarkable. IMPRESSION: 1. No acute findings. No bowel obstruction or evidence of bowel wall inflammation. No evidence of acute solid organ abnormality. No renal or ureteral calculi seen. No hydronephrosis. 2. Aortic  atherosclerosis. 3. Probable bilateral varicoceles. Electronically Signed   By: Bary Richard M.D.   On: 03/12/2017 14:11    12-lead ECG SR All prior EKG's in EPIC reviewed with no documented atrial fibrillation  Telemetry SR, early in his stay had NSVT episode, also noted wide complex rhythm, is regular, 70's-80's rates, none noted since here at [facility], no AFib  Assessment and Plan:  1. Cryptogenic stroke The patient presents with cryptogenic stroke.  The patient has a TEE planned for this AM.  I spoke at length with the patient and his wife about monitoring for afib with either a 30 day event monitor or an implantable loop recorder.   The patient is having some difficulty with retention of information post stroke, his wife reports not quite back to his baseline, but better every day.   Risks, benefits, and alteratives to implantable loop recorder were discussed with the patient/wife today.   At this time, the patient defers to his wife, and  they are undecided, would like to talk with family and further with his other doctors.  He is followed with Dr. Jacinto Mcdaniel out patient, would start there and can refer to EP for loop if decided on later.  Please recall EP service if the patient/family decided to pursue loop implant    Renee Norberto Sorenson, PA-C 03/19/2017  EP Attending  Agree with above. He will followup with Dr. Jacinto Mcdaniel. I am happy to see him as an outpatient as needed. He was discharged  before I saw him on this admit.  Leonia Reeves.D.

## 2017-03-19 NOTE — Progress Notes (Signed)
TEE shows PFO. May not need loop recorder. Dr. Jacinto HalimGanji will see him for cardiology follow up and consideration for PFO closure on 03/28/2017 at 1:00 PM. Also, ischemic workup will be outpatient. Ok to discharge from cardiology standpoint.   Anthony NegusManish J Janice Bodine, MD [facility] Pager: [phone] Office: [phone] If no answer Cell [phone]

## 2017-03-19 NOTE — Progress Notes (Signed)
Patient discharged home. Discharge instructions were reviewed with the patient and wife. Patient & wife verbalized understanding.

## 2017-03-19 NOTE — Progress Notes (Signed)
Patient has returned from TEE procedure. RN attempted to administer med's. Patient continues to be lethargic. Wife will call when patient is more alert to take medications.

## 2017-03-19 NOTE — Progress Notes (Signed)
Bilateral lower extremity venous duplex completed. No evidence of a DVT, superficial thrombosis, or Baker's cyst. Toma DeitersVirginia Mercer Stallworth, RVS 03/19/2017 4:42 PM

## 2017-03-19 NOTE — CV Procedure (Addendum)
TEE: Under moderate sedation, TEE was performed without complications: LV: Moderate concentric LVH. Normal EF. RV: Normal LA: Normal. Left atrial appendage: Normal without thrombus. Normal function. PFO seen with double contrast study positive for atrial level shunting. RA: Normal  MV: Normal Trace MR. TV: Normal Trace TR AV: Normal. No AI or AS. PV: Normal. Trace PI.  Thoracic and ascending aorta: Normal without significant plaque or atheromatous changes.  Conscious sedation protocol was followed, I personally administered conscious sedation and monitored the patient. Patient received 100 milligrams of Versed and 10 mcg fentanyl. Patient tolerated the procedure well and there was no complication from conscious sedation. Time administered was 30 and procedure ended at 11:05 AM.  Elder NegusManish J Keary Waterson, MD [facility] Pager: [phone] Office: [phone] If no answer Cell [phone]

## 2017-03-19 NOTE — Care Management Note (Addendum)
Case Management Note  Patient Details  Name: Anthony Mcdaniel MRN: [id number] Date of Birth: Nov 12, 1970  Subjective/Objective:     Pt admitted with CVA. He is from home with spouse.  PCP:   Dr Stephanie Acre         Action/Plan: CM consulted for outpatient therapy. CM met with wife and she was interested in him attending the [facility] Orders in Epic and information on the AVS. Pt with orders for 3 in 1. Wife not sure they will need. CM provided her the orders for the DME and where she could pick it up if they decide they want the DME. Wife voiced understanding. Wife able to provide transportation home when patient ready for d/c.  CM signing off.   Expected Discharge Date:  (unknown)               Expected Discharge Plan:  OP Rehab  In-House Referral:     Discharge planning Services  CM Consult  Post Acute Care Choice:    Choice offered to:     DME Arranged:  3-N-1(wife given orders) DME Agency:     HH Arranged:    HH Agency:     Status of Service:  Completed, signed off  If discussed at [employer] of Stay Meetings, dates discussed:    Additional Comments:  Pollie Friar, RN 03/19/2017, 10:35 AM

## 2017-06-11 DIAGNOSIS — Z8774 Personal history of (corrected) congenital malformations of heart and circulatory system: Secondary | ICD-10-CM

## 2017-06-11 NOTE — H&P (Signed)
OFFICE VISIT NOTES COPIED TO EPIC FOR DOCUMENTATION  . History of Present Illness Anthony Page MD; 05/08/2017 6:22 AM) Patient words: Last O/V 03/28/2017; F/U Nuc, Labs & Event Monitor results.  The patient is a 47 year old male who presents for a Follow-up for Hypertension.  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 He has had negative sleep study in 2014, no evidence of renal artery stenosis by CTA in 2018.   Admitted with embolic stroke, RI 7/85/8850 revealed multiple numerous small supratentorial acute infarcts spanning multiple vascular territories and altered mental status on 03/12/2017 and found to have PFO by TEE. He presented with generalized weakness, altered mental status, hypotension, transient circulatory shock, acute renal failure. He also developed 39 beat of NSVT on the 03/15/2017 and cardiac markers were positive for on STEMI. He also had influenza pneumonia while in the hospital.  He is accompanied by his wife at the bedside, he is now returned to his baseline mental status. Wants to return to work. Due to NSVT, and went nuclear stress test in April 2019 which was nonischemic. Presents for follow-up.   Problem List/Past Medical Frances Furbish Johnson; 05/07/2017 3:34 PM) Resistant hypertension (I10)  Hypercholesteremia (E78.00)  Hypertensive heart disease without heart failure (I11.9)  Hyperglycemia (R73.9)  Labwork  Labs 04/06/2017: Serum glucose 90 mg, BUN 15, creatinine 1.63, eGFR 50/58 mL, potassium 4.0. 03/19/2017: Cholesterol 129, triglycerides 187, HDL 23, LDL 69. RBC 4, hemoglobin 2.5, hematocrit 33.4, CBC otherwise normal. Potassium 4.0, creatinine 1.68, EGFR 55, BMP otherwise normal. Hemoglobin A1c 5.7%. 03/16/2017: Potassium 4.0, creatinine  1.67, AST 47, ALT 83, EGFR 85, CMP otherwise normal. 10/24/2016: Creatinine 1.25, EGFR 62/75, potassium 3.8, BMP normal. 03/19/2015: Total cholesterol 251, triglycerides 168, HDL 35, LDL 182, TSH 1.908, CBC normal, BNP 212, renin activity elevated at 6.438, however, aldosterone and aldosterone:renin activity normal, creatinine 1.48, eGFR < 60, potassium 3.5 Hypertensive kidney disease with chronic kidney disease stage III (I12.9)  Morbid obesity (E66.01)  Right carotid bruit (R09.89)  Carotid artery duplex 06/15/2016: No hemodynamically significant arterial disease in the internal carotid artery bilaterally. There is mild soft plaque bilaterally. Antegrade right vertebral artery flow. Antegrade left vertebral artery flow. NSTEMI (non-ST elevated myocardial infarction) (I21.4)  Exercise myoview stress 04/06/2017: 1. The patient performed treadmill exercise using a Bruce protocol, completing 7:44 minutes. The patient completed an estimated workload of 9.73 METS, reaching 87% of the maximum predicted heart rate. Exercise capacity is normal for age. The patient did not develop symptoms other than fatigue during the procedure. Resting hypertension with normal blood pressure response at peak exercise. Peak BP 152/64 mmHg. Stress electrocardiogram is negative for ischemia. 2. The overall quality of the study is excellent. There is no evidence of abnormal lung activity. Stress and rest SPECT images demonstrate homogeneous tracer distribution throughout the myocardium. Gated SPECT imaging reveals normal myocardial thickening and wall motion. The left ventricular ejection fraction was normal (54%). 3. Low risk study. NSVT (nonsustained ventricular tachycardia) (Y77.4)  Embolic stroke involving cerebral artery (I63.40)  MRI of brain 03/15/2017: 1. Motion degraded examination. 2. Numerous small supratentorial acute infarcts spanning multiple vascular territories. Differential diagnosis includes embolic phenomena,  hypotensive episode, vasculopathy. 3. Mild chronic small vessel ischemic disease, advanced for age. PFO (patent foramen ovale) (Q21.1)  TEE 03/19/2017: Left ventricle: There was mild concentric hypertrophy.  The estimated ejection fraction was in the range of 55% to 60%. - Left atrium: No evidence of thrombus in the atrial cavity or appendage. Atrial septum: Moderate sized PFO with right to left shunting confirmed by agitated saline contrast study. Eusthesian valve noted.  Allergies Frances Furbish Johnson; 05/07/2017 3:34 PM) No Known Drug Allergies [12/20/2015]:  Family History Cheri Kearns; 05/07/2017 3:34 PM) Mother  In good health. Hx of Stroke Father  In good health. HTN, presently on Dialysis Sister 1  younger  Social History Cheri Kearns; 05/07/2017 3:34 PM) Current tobacco use  Never smoker. Alcohol Use  Occasional alcohol use. Marital status  Married. Number of Children  2. Living Situation  Lives with spouse.  Past Surgical History Cheri Kearns; 05/07/2017 3:34 PM) None [03/29/2015]:  Medication History (April Louretta Shorten; 05/07/2017 3:50 PM) Irbesartan (150MG Tablet, 1 (one) Tablet Oral daily, Taken starting 03/28/2017) Active. AmLODIPine Besylate (5MG Tablet, 1 (one) Tablet Oral daily in the evening, Taken starting 03/28/2017) Active. Clopidogrel Bisulfate (75MG Tablet, 1 (one) Tablet Oral daily, Taken starting 03/28/2017) Active. Bystolic (5MG Tablet, 1 (one) T Tablet Tablet Oral daily, Taken starting 02/27/2017) Active. (d/c metoprolol) Isosorbide Mononitrate ER (30MG Tablet ER 24HR, 1 Tablet Oral daily, Taken starting 05/04/2016) Active. Aspirin EC (81MG Tablet DR, 1 Oral daily) Active. Atorvastatin Calcium (80MG Tablet, 1 Oral daily, Taken starting 03/28/2017) Active. Medications Reconciled (pt brought medications)  Diagnostic Studies History Anthony Page, MD; 05/07/2017 4:02 PM) Echocardiogram  03/12/2017 [place]: - Left ventricle: The cavity  size was normal. Wall thickness was increased in a pattern of severe LVH. There was severe concentric hypertrophy. The estimated ejection fraction was in the range of 65% to 70%. Wall motion was normal; there were no regional wall motion abnormalities. Doppler parameters are consistent with abnormal left ventricular relaxation (grade 1 diastolic dysfunction). - Left atrium: The atrium was mildly dilated. - No significant valvular abnormality. Inadequate TR jet to estimate PASP. Normal right atrial pressure. Carotid Doppler  06/15/2016: No hemodynamically significant arterial disease in the internal carotid artery bilaterally. There is mild soft plaque bilaterally. Antegrade right vertebral artery flow. Antegrade left vertebral artery flow. TEE  03/19/2017: Left ventricle: There was mild concentric hypertrophy. The estimated ejection fraction was in the range of 55% to 60%. - Left atrium: No evidence of thrombus in the atrial cavity or appendage. Atrial septum: Moderate sized PFO with right to left shunting confirmed by agitated saline contrast study. Eusthesian valve noted. Renal Duplex [04/21/2015]: No evidence of renal artery occlusive disease in either renal artery. The ostium of the renal arteries not well visualized due to bodily habitus. Normal intrarenal vascular perfusion is noted in both kidneys. Dilated abdominal aorta suggestive of abdominal aortic aneurysm measuring 3cmx3cm. Consider rechecking abdominal aortic duplex in a year. Nuclear stress test  04/06/2017: 1. The patient performed treadmill exercise using a Bruce protocol, completing 7:44 minutes. The patient completed an estimated workload of 9.73 METS, reaching 87% of the maximum predicted heart rate. Exercise capacity is normal for age. The patient did not develop symptoms other than fatigue during the procedure. Resting hypertension with normal blood pressure response at peak exercise. Peak BP 152/64 mmHg. Stress electrocardiogram  is negative for ischemia. 2. The overall quality of the study is excellent. There is no evidence of abnormal lung activity. Stress and rest SPECT images demonstrate homogeneous tracer distribution throughout the myocardium. Gated SPECT imaging reveals normal myocardial thickening and wall motion. The left ventricular ejection fraction was normal (54%). 3. Low risk study. Event  monitor [04/2017]: Event monitor for 30 days 04/14/2017: Predominant rhythm is normal sinus rhythm with ST segment depression at even minimal exertion. One episode of chest pain reveals normal sinus rhythm at maximum heart rate was 125 bpm, heart rate 42 bpm at 5 AM. One episode of 4 beat wide complex tachycardia at 4 AM, suggests NSVT. Had underlying wide-complex rhythm without discernible P waves at the rate of 80 bpm and hence may suggest junctional rhythm with brief NSVT. Sleep Study [2014]: Negative for OSA    Review of Systems Anthony Page MD; 05/08/2017 6:20 AM) General Not Present- Anorexia, Fatigue and Fever. Respiratory Not Present- Cough, Decreased Exercise Tolerance, Difficulty Breathing on Exertion and Dyspnea. Cardiovascular Not Present- Chest Pain, Claudications, Edema, Orthopnea, Palpitations and Paroxysmal Nocturnal Dyspnea. Gastrointestinal Not Present- Black, Tarry Stool, Change in Bowel Habits and Nausea. Neurological Not Present- Focal Neurological Symptoms, Headaches and Syncope. Endocrine Not Present- Cold Intolerance, Excessive Sweating, Heat Intolerance and Thyroid Problems. Hematology Not Present- Anemia, Easy Bruising, Petechiae and Prolonged Bleeding. All other systems negative  Vitals (April Garrison; 05/07/2017 3:52 PM) 05/07/2017 3:45 PM Weight: 258 lb Height: 69in Body Surface Area: 2.3 m Body Mass Index: 38.1 kg/m  Pulse: 98 (Regular)  P.OX: 95% (Room air) BP: 134/84 (Sitting, Left Arm, Standard)       Physical Exam Anthony Page, MD; 05/08/2017 6:17  AM) General Mental Status-Alert. General Appearance-Cooperative and Appears stated age. Build & Nutrition-Muscular, Well built and Moderately obese.  Head and Neck Thyroid Gland Characteristics - normal size and consistency and no palpable nodules.  Chest and Lung Exam Chest and lung exam reveals -quiet, even and easy respiratory effort with no use of accessory muscles, non-tender and on auscultation, normal breath sounds, no adventitious sounds.  Cardiovascular Cardiovascular examination reveals -abdominal aorta auscultation reveals no bruits and no prominent pulsation, femoral artery auscultation bilaterally reveals normal pulses, no bruits, no thrills and normal pedal pulses bilaterally. Auscultation Heart Sounds - S1 WNL and S2 WNL. Murmurs & Other Heart Sounds - Normal exam - No Murmurs.  Abdomen Palpation/Percussion Normal exam - Non Tender and No hepatosplenomegaly.  Peripheral Vascular Carotid arteries - Right-Soft Bruit.  Neurologic Neurologic evaluation reveals -alert and oriented x 3 with no impairment of recent or remote memory. Motor-Grossly intact without any focal deficits.  Musculoskeletal Global Assessment Left Lower Extremity - no deformities, masses or tenderness, no known fractures. Right Lower Extremity - no deformities, masses or tenderness, no known fractures.    Assessment & Plan Anthony Page MD; 05/09/2017 2:03 PM) NSTEMI (non-ST elevated myocardial infarction) (I21.4) Story: Exercise myoview stress 04/06/2017: 1. The patient performed treadmill exercise using a Bruce protocol, completing 7:44 minutes. The patient completed an estimated workload of 9.73 METS, reaching 87% of the maximum predicted heart rate. Exercise capacity is normal for age. The patient did not develop symptoms other than fatigue during the procedure. Resting hypertension with normal blood pressure response at peak exercise. Peak BP 152/64 mmHg. Stress  electrocardiogram is negative for ischemia. 2. The overall quality of the study is excellent. There is no evidence of abnormal lung activity. Stress and rest SPECT images demonstrate homogeneous tracer distribution throughout the myocardium. Gated SPECT imaging reveals normal myocardial thickening and wall motion. The left ventricular ejection fraction was normal (54%). 3. Low risk study. NSVT (nonsustained ventricular tachycardia) (I47.2) Story: Event monitor for 30 days 04/14/2017: Predominant rhythm is normal sinus rhythm with ST segment depression at even minimal exertion. One episode of chest pain reveals normal  sinus rhythm at maximum heart rate was 125 bpm, heart rate 42 bpm at 5 AM. One episode of 4 beat wide complex tachycardia at 4 AM, suggests NSVT. Had underlying wide-complex rhythm without discernible P waves at the rate of 80 bpm and hence may suggest junctional rhythm with brief NSVT. Resistant hypertension (I10) Impression: CTA abdomen and pelvis: 04/26/2016: Minimal atheromatous plaque aorta. Celiac vessels normal. Minimal plaque right renal artery. No stenosis. Otherwise within normal limits. No AAA. Current Plans Changed Bystolic 40GQ, 1 (one) Tablet daily, #90, 90 days starting 05/07/2017, Ref. x1. Changed amLODIPine Besylate 10MG, 1 (one) Tablet daily in the evening, #90, 90 days starting 67/61/9509, Ref. x3. Embolic stroke involving cerebral artery (I63.40) Story: MRI of brain 03/15/2017: 1. Motion degraded examination. 2. Numerous small supratentorial acute infarcts spanning multiple vascular territories. Differential diagnosis includes embolic phenomena, hypotensive episode, vasculopathy. 3. Mild chronic small vessel ischemic disease, advanced for age. Snoring (R06.83) PFO (patent foramen ovale) (Q21.1) Story: TEE 03/19/2017: Left ventricle: There was mild concentric hypertrophy. The estimated ejection fraction was in the range of 55% to 60%. - Left atrium: No evidence of  thrombus in the atrial cavity or appendage. Atrial septum: Moderate sized PFO with right to left shunting confirmed by agitated saline contrast study. Eusthesian valve noted. Labwork 06/01/2017: Creatinine 1.29, EGFR 66/76, potassium 4.3, BMP otherwise normal.  Hemoglobin 12.5, hematocrit 36.9, MCV 75, MCH 26.2, CBC otherwise normal.  Labs 04/06/2017: Serum glucose 90 mg, BUN 15, creatinine 1.63, eGFR 50/58 mL, potassium 4.0.  03/19/2017: Cholesterol 129, triglycerides 187, HDL 23, LDL 69. RBC 4, hemoglobin 2.5, hematocrit 33.4, CBC otherwise normal. Potassium 4.0, creatinine 1.68, EGFR 55, BMP otherwise normal. Hemoglobin A1c 5.7%.  03/16/2017: Potassium 4.0, creatinine 1.67, AST 47, ALT 83, EGFR 85, CMP otherwise normal.  10/24/2016: Creatinine 1.25, EGFR 62/75, potassium 3.8, BMP normal.  03/19/2015: Total cholesterol 251, triglycerides 168, HDL 35, LDL 182, TSH 1.908, CBC normal, BNP 212, renin activity elevated at 6.438, however, aldosterone and aldosterone:renin activity normal, creatinine 1.48, eGFR < 60, potassium 3.5  Note:. 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 He has had negative sleep study in 2014, no evidence of renal artery stenosis by CTA in 2018.   Admitted with embolic stroke, RI 04/11/7122 revealed multiple numerous small supratentorial acute infarcts spanning multiple vascular territories and altered mental status on 03/12/2017 and found to have PFO by TEE. He presented with generalized weakness, altered mental status, hypotension, transient circulatory shock, acute renal failure. He also developed 39 beat of NSVT on the 03/15/2017 and cardiac markers were positive for on STEMI. He also had influenza pneumonia while in the hospital.  He is now recuperated well, renal function has remained stable, her pressure is still elevated and hence  will increase Bystolic from 5 mg to 10 mg and amlodipine from 5 mg to 10 mg. His had a negative sleep study in 2014 with I probably will repeat sleep study again. Due to NSVT, nuclear stress test in Apr 2019 is completely nonischemic and he had normal exercise tolerance without inducible arrhythmias hence arrhythmias occurring mostly at night may be related to sleep apnea. Refer to Star Age, MD. This was a greater than 40 minute office visit with greater than 50% of the time spent with face-to-face encounter with patient and evaluation of complex medical issues, review of external records and coordination of care.  Due to a  moderate-sized PFO and extensive infarcts which appeared to be embolic, he'll benefit from PFO closure. 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 Patient has become nocturnal and states of a watching TV. We also discussed regarding weight loss and avoidance of salty food. I'll see him back after the PFO closure. I have discussed with Dr. Leonie Man who also feels it is appropriate to proceed.  CC: Dr. [name] (PCP); CC: Dr. Star Age. (Sleep Med)    Signed by Anthony Page, MD (05/09/2017 2:04 PM)

## 2017-06-12 ENCOUNTER — Ambulatory Visit (HOSPITAL_COMMUNITY)
Admission: RE | Disposition: A | Discharge status: Home / Self Care | Payer: Self-pay | Source: Ambulatory Visit | Attending: Cardiology

## 2017-06-12 ENCOUNTER — Ambulatory Visit (HOSPITAL_COMMUNITY)
Admission: RE | Admit: 2017-06-12 | Discharge: 2017-06-12 | Disposition: A | Discharge status: Home / Self Care | Payer: 59 | Source: Ambulatory Visit | Attending: Cardiology | Admitting: Cardiology

## 2017-06-12 DIAGNOSIS — Z8673 Personal history of transient ischemic attack (TIA), and cerebral infarction without residual deficits: Secondary | ICD-10-CM | POA: Insufficient documentation

## 2017-06-12 DIAGNOSIS — Q211 Atrial septal defect: Secondary | ICD-10-CM | POA: Insufficient documentation

## 2017-06-12 DIAGNOSIS — I1310 Hypertensive heart and chronic kidney disease without heart failure, with stage 1 through stage 4 chronic kidney disease, or unspecified chronic kidney disease: Secondary | ICD-10-CM | POA: Diagnosis not present

## 2017-06-12 DIAGNOSIS — R739 Hyperglycemia, unspecified: Secondary | ICD-10-CM | POA: Insufficient documentation

## 2017-06-12 DIAGNOSIS — I472 Ventricular tachycardia: Secondary | ICD-10-CM | POA: Insufficient documentation

## 2017-06-12 DIAGNOSIS — Z6838 Body mass index (BMI) 38.0-38.9, adult: Secondary | ICD-10-CM | POA: Diagnosis not present

## 2017-06-12 DIAGNOSIS — E7800 Pure hypercholesterolemia, unspecified: Secondary | ICD-10-CM | POA: Insufficient documentation

## 2017-06-12 DIAGNOSIS — I252 Old myocardial infarction: Secondary | ICD-10-CM | POA: Diagnosis not present

## 2017-06-12 DIAGNOSIS — Z7982 Long term (current) use of aspirin: Secondary | ICD-10-CM | POA: Insufficient documentation

## 2017-06-12 DIAGNOSIS — N183 Chronic kidney disease, stage 3 (moderate): Secondary | ICD-10-CM | POA: Insufficient documentation

## 2017-06-12 DIAGNOSIS — Z8774 Personal history of (corrected) congenital malformations of heart and circulatory system: Secondary | ICD-10-CM

## 2017-06-12 HISTORY — PX: PATENT FORAMEN OVALE(PFO) CLOSURE: CATH118300

## 2017-06-12 LAB — POCT ACTIVATED CLOTTING TIME
ACTIVATED CLOTTING TIME: 235 s
ACTIVATED CLOTTING TIME: 257 s
Activated Clotting Time: 180 seconds
Activated Clotting Time: 202 seconds

## 2017-06-12 SURGERY — PATENT FORAMEN OVALE (PFO) CLOSURE
Anesthesia: LOCAL

## 2017-06-12 MED ORDER — SODIUM CHLORIDE 0.9 % IV SOLN: 250.0000 mL | INTRAVENOUS | Status: DC | PRN

## 2017-06-12 MED ORDER — MIDAZOLAM HCL 2 MG/2ML IJ SOLN
INTRAMUSCULAR | Status: DC | PRN
  Administered 2017-06-12 (×2): 2 mg via INTRAVENOUS

## 2017-06-12 MED ORDER — LIDOCAINE HCL (PF) 1 % IJ SOLN
INTRAMUSCULAR | Status: AC
  Filled 2017-06-12: qty 30

## 2017-06-12 MED ORDER — ONDANSETRON HCL 4 MG/2ML IJ SOLN: 4.0000 mg | Freq: Four times a day (QID) | INTRAMUSCULAR | Status: DC | PRN

## 2017-06-12 MED ORDER — ASPIRIN 81 MG PO CHEW
81.0000 mg | CHEWABLE_TABLET | ORAL | Status: AC
  Administered 2017-06-12: 81 mg via ORAL

## 2017-06-12 MED ORDER — SODIUM CHLORIDE 0.9% FLUSH: 3.0000 mL | Freq: Two times a day (BID) | INTRAVENOUS | Status: DC

## 2017-06-12 MED ORDER — ASPIRIN 81 MG PO CHEW
CHEWABLE_TABLET | ORAL | Status: AC
  Filled 2017-06-12: qty 1

## 2017-06-12 MED ORDER — CLOPIDOGREL BISULFATE 300 MG PO TABS
ORAL_TABLET | ORAL | Status: AC
  Filled 2017-06-12: qty 1

## 2017-06-12 MED ORDER — SODIUM CHLORIDE 0.9% FLUSH: 3.0000 mL | INTRAVENOUS | Status: DC | PRN

## 2017-06-12 MED ORDER — LIDOCAINE HCL (PF) 1 % IJ SOLN
INTRAMUSCULAR | Status: DC | PRN
  Administered 2017-06-12: 10 mL
  Administered 2017-06-12: 7 mL

## 2017-06-12 MED ORDER — HEPARIN SODIUM (PORCINE) 1000 UNIT/ML IJ SOLN
INTRAMUSCULAR | Status: AC
  Filled 2017-06-12: qty 1

## 2017-06-12 MED ORDER — HEPARIN (PORCINE) IN NACL 1000-0.9 UT/500ML-% IV SOLN
INTRAVENOUS | Status: AC
  Filled 2017-06-12: qty 1000

## 2017-06-12 MED ORDER — CEFAZOLIN SODIUM-DEXTROSE 2-4 GM/100ML-% IV SOLN
INTRAVENOUS | Status: AC
  Filled 2017-06-12: qty 100

## 2017-06-12 MED ORDER — CEFAZOLIN SODIUM 1 G IJ SOLR
2.0000 g | Freq: Once | INTRAMUSCULAR | Status: AC
  Administered 2017-06-12: 2 g via INTRAMUSCULAR

## 2017-06-12 MED ORDER — ACETAMINOPHEN 325 MG PO TABS: 650.0000 mg | ORAL_TABLET | ORAL | Status: DC | PRN

## 2017-06-12 MED ORDER — HYDROMORPHONE HCL 1 MG/ML IJ SOLN
INTRAMUSCULAR | Status: DC | PRN
  Administered 2017-06-12: 0.5 mg via INTRAVENOUS

## 2017-06-12 MED ORDER — HEPARIN (PORCINE) IN NACL 2-0.9 UNITS/ML
INTRAMUSCULAR | Status: AC | PRN
  Administered 2017-06-12 (×2): 500 mL

## 2017-06-12 MED ORDER — CLOPIDOGREL BISULFATE 300 MG PO TABS
ORAL_TABLET | ORAL | Status: DC | PRN
  Administered 2017-06-12: 600 mg via ORAL

## 2017-06-12 MED ORDER — HEPARIN SODIUM (PORCINE) 1000 UNIT/ML IJ SOLN
INTRAMUSCULAR | Status: DC | PRN
  Administered 2017-06-12: 3000 [IU] via INTRAVENOUS
  Administered 2017-06-12: 8000 [IU] via INTRAVENOUS
  Administered 2017-06-12: 1000 [IU] via INTRAVENOUS

## 2017-06-12 MED ORDER — HYDROMORPHONE HCL 1 MG/ML IJ SOLN
INTRAMUSCULAR | Status: AC
  Filled 2017-06-12: qty 0.5

## 2017-06-12 MED ORDER — SODIUM CHLORIDE 0.9 % WEIGHT BASED INFUSION
3.0000 mL/kg/h | INTRAVENOUS | Status: AC
  Administered 2017-06-12: 3 mL/kg/h via INTRAVENOUS

## 2017-06-12 MED ORDER — SODIUM CHLORIDE 0.9 % WEIGHT BASED INFUSION: 1.0000 mL/kg/h | INTRAVENOUS | Status: DC

## 2017-06-12 MED ORDER — MIDAZOLAM HCL 2 MG/2ML IJ SOLN
INTRAMUSCULAR | Status: AC
  Filled 2017-06-12: qty 2

## 2017-06-12 SURGICAL SUPPLY — 13 items
CATH ACUNAV 8FR 90CM (CATHETERS) ×2 IMPLANT
CATH INFINITI 6F MPA2 100CM (CATHETERS) ×2 IMPLANT
COVER PRB 48X5XTLSCP FOLD TPE (BAG) ×1 IMPLANT
COVER PROBE 5X48 (BAG) ×1
COVER SWIFTLINK CONNECTOR (BAG) ×2 IMPLANT
GUIDEWIRE AMPLATZER 1.5JX260 (WIRE) ×2 IMPLANT
KIT MICROPUNCTURE NIT STIFF (SHEATH) ×2 IMPLANT
OCCLUDER AMPLATZER PFO 25MM (Prosthesis & Implant Heart) ×2 IMPLANT
PACK CARDIAC CATHETERIZATION (CUSTOM PROCEDURE TRAY) ×2 IMPLANT
SHEATH INTROD W/O MIN 9FR 25CM (SHEATH) ×2 IMPLANT
SHEATH PINNACLE 8F 10CM (SHEATH) ×2 IMPLANT
SYSTEM DELIVERY AMPLATZER 8FR (SHEATH) ×2 IMPLANT
WIRE EMERALD 3MM-J .035X150CM (WIRE) ×2 IMPLANT

## 2017-06-12 NOTE — Progress Notes (Signed)
Site area: Right groin a 9 and 8 french venous sheath was removed  Site Prior to Removal:  Level 0  Pressure Applied For 30 MINUTES    Bedrest Beginning at 1400p  Manual:   Yes.    Patient Status During Pull:  stable  Post Pull Groin Site:  Level 0  Post Pull Instructions Given:  Yes.    Post Pull Pulses Present:  Yes.    Dressing Applied:  Yes.    Comments: VS remain stable

## 2017-06-12 NOTE — Interval H&P Note (Signed)
History and Physical Interval Note:  06/12/2017 10:47 AM  Anthony Mcdaniel  has presented today for surgery, with the diagnosis of pfo  The various methods of treatment have been discussed with the patient and family. After consideration of risks, benefits and other options for treatment, the patient has consented to  Procedure(s): PATENT FORAMEN OVALE (PFO) CLOSURE (N/A) as a surgical intervention .  The patient's history has been reviewed, patient examined, no change in status, stable for surgery.  I have reviewed the patient's chart and labs.  Questions were answered to the patient's satisfaction.     Yates Decamp

## 2017-06-12 NOTE — Progress Notes (Signed)
Up and walked and tolerated well; right groin stable no bleeding or hematoma 

## 2017-06-12 NOTE — Discharge Instructions (Signed)
Patent Foramen Ovale Closure, Adult, Care After   This sheet gives you information about how to care for yourself after your procedure. Your health care provider may also give you more specific instructions. If you have problems or questions, contact your health care provider.  What can I expect after the procedure?  After the procedure, it is common to have:  · Bruising.  · Tenderness in the area where the long, thin tube was inserted into your inner thigh or groin area (catheter insertion site).    Follow these instructions at home:  Catheter insertion site care  · Follow instructions from your health care provider about how to take care of your incision or puncture. Make sure you:  ? Wash your hands with soap and water before you change your bandage (dressing). If soap and water are not available, use hand sanitizer.  ? Change your dressing as told by your health care provider.  ? Leave stitches (sutures), skin glue, or adhesive strips in place. These skin closures may need to stay in place for 2 weeks or longer. If adhesive strip edges start to loosen and curl up, you may trim the loose edges. Do not remove adhesive strips completely unless your health care provider tells you to do that.  · Check your catheter insertion site every day for signs of infection. Check for:  ? Redness, swelling, or pain.  ? Fluid or blood.  ? Warmth.  ? Pus or a bad smell.  ? A lump or bump.  Activity  · Do not lift anything that is heavier than 10 lb (4.5 kg), or the limit that your health care provider tells you, until he or she says that it is safe.  · Return to your normal activities as told by your health care provider. Ask your health care provider what activities are safe for you.  · Do not drive until your health care provider approves.  Medicines  · Take over-the-counter and prescription medicines only as told by your health care provider.  · You may need to take medicines to prevent blood clots for 6 months or longer. You  may have to take aspirin and clopidogrel. Clopidogrel is a blood thinner (anticoagulant) that helps to prevent heart attacks and strokes.  Lifestyle  · Avoid drinking alcohol.  · Do not use any products that contain nicotine or tobacco, such as cigarettes and e-cigarettes. If you need help quitting, ask your health care provider.  General instructions  · Drink enough fluid to keep your urine clear or pale yellow. This helps to get rid of the dye that was used during the procedure.  · Tell all health care providers and dental care providers who care for you that you had a patent foramen ovale closure. Do this before having any type of test or surgery.  · Ask your health care provider if you need to take antibiotic medicine before dental procedures and surgeries. This may be necessary to prevent infection.  · While taking anticoagulants:  ? Prevent falls by removing loose rugs and extension cords from areas where you walk.  ? Be very careful when using knives, scissors, or other sharp objects.  ? Do not play contact sports or participate in other activities that have a high risk of injury.  · Do not take baths, swim, or use a hot tub until your health care provider approves.  · Keep all follow-up visits as told by your health care provider. This is important.  Contact   a health care provider if:  · You have a fever.  · You have pain that does not get better with medicine.  · You have fluid or blood coming from your catheter insertion site.  · You have a hard lump or bump at your catheter insertion site.  Get help right away if:  · You have trouble breathing.  · You have chest pain.  · You have redness, swelling, or pain around your catheter insertion site.  · Your catheter insertion site feels warm to the touch.  · You have pus or a bad smell coming from your catheter insertion site.  · You have severe pain in your arm or jaw.  Summary  · After the procedure, it is common for you to have some bruising and tenderness  where a tube was inserted into your inner thigh or groin area (catheter insertion site).  · Check your catheter insertion site every day for signs of infection, such as redness, swelling, or pain.  · Before any procedure or test, tell all health care providers and dental providers who care for you that you had a patent foramen ovale closure.  This information is not intended to replace advice given to you by your health care provider. Make sure you discuss any questions you have with your health care provider.  Document Released: 02/03/2016 Document Revised: 02/03/2016 Document Reviewed: 02/03/2016  [employer] Interactive Patient Education © 2018 [employer]

## 2017-06-13 ENCOUNTER — Encounter (HOSPITAL_COMMUNITY): Payer: Self-pay | Admitting: Cardiology

## 2017-06-13 MED FILL — Cefazolin Sodium-Dextrose IV Solution 2 GM/100ML-4%: INTRAVENOUS | Qty: 100 | Status: AC

## 2017-06-13 MED FILL — Heparin Sod (Porcine)-NaCl IV Soln 1000 Unit/500ML-0.9%: INTRAVENOUS | Qty: 1000 | Status: AC

## 2017-06-28 ENCOUNTER — Encounter: Payer: Self-pay | Admitting: Neurology

## 2017-06-28 ENCOUNTER — Ambulatory Visit (INDEPENDENT_AMBULATORY_CARE_PROVIDER_SITE_OTHER): Payer: 59 | Admitting: Neurology

## 2017-06-28 VITALS — BP 125/82 | HR 64 | Ht 71.0 in | Wt 261.5 lb

## 2017-06-28 DIAGNOSIS — I214 Non-ST elevation (NSTEMI) myocardial infarction: Secondary | ICD-10-CM

## 2017-06-28 DIAGNOSIS — I472 Ventricular tachycardia: Secondary | ICD-10-CM

## 2017-06-28 DIAGNOSIS — Q211 Atrial septal defect: Secondary | ICD-10-CM

## 2017-06-28 DIAGNOSIS — G4726 Circadian rhythm sleep disorder, shift work type: Secondary | ICD-10-CM

## 2017-06-28 DIAGNOSIS — R351 Nocturia: Secondary | ICD-10-CM

## 2017-06-28 DIAGNOSIS — E669 Obesity, unspecified: Secondary | ICD-10-CM

## 2017-06-28 DIAGNOSIS — G4719 Other hypersomnia: Secondary | ICD-10-CM

## 2017-06-28 DIAGNOSIS — I4729 Other ventricular tachycardia: Secondary | ICD-10-CM

## 2017-06-28 DIAGNOSIS — I6340 Cerebral infarction due to embolism of unspecified cerebral artery: Secondary | ICD-10-CM

## 2017-06-28 DIAGNOSIS — R0683 Snoring: Secondary | ICD-10-CM

## 2017-06-28 DIAGNOSIS — Q2112 Patent foramen ovale: Secondary | ICD-10-CM

## 2017-06-28 NOTE — Progress Notes (Signed)
Subjective:    Patient ID: Anthony Mcdaniel is a 47 y.o. male.  HPI     Huston FoleySaima Brittley Regner, MD, PhD [facility] [address], [address] [address] [place], [place] [address]  Dear Anthony Mcdaniel,   I saw your patient, Anthony Mcdaniel, upon your kind request, in my neurologic clinic today for initial consultation of his sleep disorder, in particular, concern for underlying obstructive sleep apnea. The patient is unaccompanied today. As you know, Mr. Anthony Mcdaniel is a 47 year old right-handed gentleman with an underlying medical history of hypertension, hyperlipidemia, chronic kidney disease, right carotid bruit, status post non-STEMI, history of nonsustained V. tach, embolic stroke in February 2019, PFO with status post repair recently, and obesity, who reports snoring and excessive daytime somnolence. I reviewed your office note from 05/07/2017, which you kindly included. His Epworth sleepiness score is 9 out of 24, fatigue score is 31 out of 63. He is married and lives with his wife. They have 2 children. He works for [employer]. He is a nonsmoker and does not utilize alcohol, he does not drink caffeine daily. 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 On his days off he tries to sleep at night., Typically in bed by 10 and rise time varies from 5 to 6 AM to as late as noon. On his work days he goes to bed around 11 AM or noon, rise time around 5 to 6 PM and sometimes he takes a nap before going to work. He has to work from 11 PM to 7 AM typically. He lives at home with his wife and 2 children, ages 5519 and 5617, they have 1 dog, not in the bed room typically. There is a TV on at night. He has nocturia about 2-3 times per average sleep period. Denies morning headaches, he has a cousin with sleep apnea.He denies any residual symptoms from the stroke.  His Past Medical History Is Significant For: Past Medical History:  Diagnosis Date  . Hypercholesteremia   . Hypertension     His Past Surgical  History Is Significant For: Past Surgical History:  Procedure Laterality Date  . PATENT FORAMEN OVALE(PFO) CLOSURE N/A 06/12/2017   Procedure: PATENT FORAMEN OVALE (PFO) CLOSURE;  Surgeon: Yates DecampGanji, Jay, MD;  Location: [facility];  Service: Cardiovascular;  Laterality: N/A;  . TEE WITHOUT CARDIOVERSION N/A 03/19/2017   Procedure: TRANSESOPHAGEAL ECHOCARDIOGRAM (TEE);  Surgeon: Elder NegusPatwardhan, Manish J, MD;  [facility];  Service: Cardiovascular;  Laterality: N/A;    His Family History Is Significant For: No family history on file.  His Social History Is Significant For: Social History   Socioeconomic History  . Marital status: Married    Spouse name: Not on file  . Number of children: Not on file  . Years of education: Not on file  . Highest education level: Not on file  Occupational History  . Not on file  Social Needs  . Financial resource strain: Not on file  . Food insecurity:    Worry: Not on file    Inability: Not on file  . Transportation needs:    Medical: Not on file    Non-medical: Not on file  Tobacco Use  . Smoking status: Never Smoker  . Smokeless tobacco: Never Used  Substance and Sexual Activity  . Alcohol use: No  . Drug use: No  . Sexual activity: Not on file  Lifestyle  . Physical activity:    Days per week: Not on file  Minutes per session: Not on file  . Stress: Not on file  Relationships  . Social connections:    Talks on phone: Not on file    Gets together: Not on file    Attends religious service: Not on file    Active member of club or organization: Not on file    Attends meetings of clubs or organizations: Not on file    Relationship status: Not on file  Other Topics Concern  . Not on file  Social History Narrative  . Not on file    His Allergies Are:  No Known Allergies:   His Current Medications Are:  Outpatient Encounter Medications as of 06/28/2017  Medication Sig  . amLODipine (NORVASC) 10 MG tablet Take 10 mg by  mouth daily.  Marland Kitchen aspirin EC 81 MG tablet Take 81 mg by mouth daily.  Marland Kitchen atorvastatin (LIPITOR) 80 MG tablet Take 1 tablet (80 mg total) by mouth daily at 6 PM.  . clopidogrel (PLAVIX) 75 MG tablet Take 75 mg by mouth daily.  . irbesartan (AVAPRO) 300 MG tablet Take 300 mg by mouth daily.   . nebivolol (BYSTOLIC) 10 MG tablet Take 10 mg by mouth daily.  . [DISCONTINUED] irbesartan (AVAPRO) 150 MG tablet Take 150 mg by mouth daily.  . [DISCONTINUED] isosorbide mononitrate (IMDUR) 30 MG 24 hr tablet Take 1 tablet (30 mg total) by mouth daily.   No facility-administered encounter medications on file as of 06/28/2017.   :  Review of Systems:  Out of a complete 14 point review of systems, all are reviewed and negative with the exception of these symptoms as listed below: Review of Systems  Neurological:       Epworth Sleepiness Scale 0= would never doze 1= slight chance of dozing 2= moderate chance of dozing 3= high chance of dozing  Sitting and reading:2 Watching TV:2 Sitting inactive in a public place (ex. Theater or meeting):1 As a passenger in a car for an hour without a break:1 Lying down to rest in the afternoon:2 Sitting and talking to someone:0 Sitting quietly after lunch (no alcohol):1 In a car, while stopped in traffic:0 Total:9    Objective:  Neurological Exam  Physical Exam Physical Examination:   Vitals:   06/28/17 1315  BP: 125/82  Pulse: 64    General Examination: The patient is a very pleasant 47 y.o. male in no acute distress. He appears well-developed and well-nourished and well groomed.   HEENT: Normocephalic, atraumatic, pupils are equal, round and reactive to light and accommodation. Funduscopic exam is normal with sharp disc margins noted. Extraocular tracking is good without limitation to gaze excursion or nystagmus noted. Normal smooth pursuit is noted. Hearing is grossly intact. Tympanic membranes are clear bilaterally. Face is symmetric with normal  facial animation and normal facial sensation. Speech is clear with no dysarthria noted. There is no hypophonia. There is no lip, neck/head, jaw or voice tremor. Neck is supple with full range of passive and active motion. There are no carotid bruits on auscultation. Oropharynx exam reveals: mild mouth dryness, adequate dental hygiene and marked airway crowding, due to larger tongue, wider uvula, redundant soft palate and tonsils in place. Mallampati is class III. Tongue protrudes centrally and palate elevates symmetrically. Tonsils are 1+ in size. Neck size is 18.75 inches. He has an underbite.    Chest: Clear to auscultation without wheezing, rhonchi or crackles noted.  Heart: S1+S2+0, regular and normal without murmurs, rubs or gallops noted.   Abdomen: Soft,  non-tender and non-distended with normal bowel sounds appreciated on auscultation.  Extremities: There is no pitting edema in the distal lower extremities bilaterally. Pedal pulses are intact.  Skin: Warm and dry without trophic changes noted.  Musculoskeletal: exam reveals no obvious joint deformities, tenderness or joint swelling or erythema.   Neurologically:  Mental status: The patient is awake, alert and oriented in all 4 spheres. His immediate and remote memory, attention, language skills and fund of knowledge are appropriate. There is no evidence of aphasia, agnosia, apraxia or anomia. Speech is clear with normal prosody and enunciation. Thought process is linear. Mood is normal and affect is normal.  Cranial nerves II - XII are as described above under HEENT exam. In addition: shoulder shrug is normal with equal shoulder height noted. Motor exam: Normal bulk, strength and tone is noted. There is no drift, tremor or rebound. Romberg is negative. Reflexes are 1+ throughout. Fine motor skills and coordination: intact with normal finger taps, normal hand movements, normal rapid alternating patting, normal foot taps and normal foot  agility.  Cerebellar testing: No dysmetria or intention tremor on finger to nose testing. Heel to shin is unremarkable bilaterally. There is no truncal or gait ataxia.  Sensory exam: intact to light touch in the upper and lower extremities.  Gait, station and balance: He stands easily. No veering to one side is noted. No leaning to one side is noted. Posture is age-appropriate and stance is narrow based. Gait shows normal stride length and normal pace. No problems turning are noted. Tandem walk is unremarkable.   Assessment and plan:  In summary, Xai Frerking is a very pleasant 47 y.o.-year old male with an underlying medical history of hypertension, hyperlipidemia, chronic kidney disease, right carotid bruit, status post non-STEMI, history of nonsustained V. tach, embolic stroke in February 2019, PFO with status post repair recently, and obesity, whose history and physical exam are concerning for obstructive sleep apnea (OSA). His shift work complicates his sleep disturbance. I had a long chat with the patient about my findings and the diagnosis of OSA, its prognosis and treatment options. We talked about medical treatments, surgical interventions and non-pharmacological approaches. I explained in particular the risks and ramifications of untreated moderate to severe OSA, especially with respect to developing cardiovascular disease down the Road, including congestive heart failure, difficult to treat hypertension, cardiac arrhythmias, or stroke. Even type 2 diabetes has, in part, been linked to untreated OSA. Symptoms of untreated OSA include daytime sleepiness, memory problems, mood irritability and mood disorder such as depression and anxiety, lack of energy, as well as recurrent headaches, especially morning headaches. We talked about trying to maintain a healthy lifestyle in general, as well as the importance of weight control. I encouraged the patient to eat healthy, exercise daily and keep well  hydrated, to keep a scheduled bedtime and wake time routine, to not skip any meals and eat healthy snacks in between meals. I advised the patient not to drive when feeling sleepy. I recommended the following at this time: sleep study with potential positive airway pressure titration. (We will score hypopneas at 4%).   I explained the sleep test procedure to the patient and also outlined possible surgical and non-surgical treatment options of OSA, including the use of a custom-made dental device (which would require a referral to a specialist dentist or oral surgeon), upper airway surgical options, such as pillar implants, radiofrequency surgery, tongue base surgery, and UPPP (which would involve a referral to an ENT surgeon).  Rarely, jaw surgery such as mandibular advancement may be considered.  I also explained the CPAP treatment option to the patient, who indicated that he would be willing to try CPAP if the need arises. I explained the importance of being compliant with PAP treatment, not only for insurance purposes but primarily to improve His symptoms, and for the patient's long term health benefit, including to reduce His cardiovascular risks. I answered all his questions today and the patient was in agreement. I would like to see her back after the sleep study is completed and encouraged him to call with any interim questions, concerns, problems or updates.   Thank you very much for allowing me to participate in the care of this nice patient. If I can be of any further assistance to you please do not hesitate to call me at [phone].  Sincerely,   Huston Foley, MD, PhD

## 2017-06-28 NOTE — Patient Instructions (Signed)

## 2017-08-13 ENCOUNTER — Telehealth: Payer: Self-pay | Admitting: Neurology

## 2017-08-13 NOTE — Telephone Encounter (Signed)
We have attempted to call the patient 2 times to schedule sleep study. Patient has been unavailable at the phone numbers we have on file and has not returned our calls. At this point we will send a letter asking pt to please contact the sleep lab to schedule their sleep study. If patient calls back we will schedule them for their sleep study. ° °

## 2018-02-21 ENCOUNTER — Ambulatory Visit (INDEPENDENT_AMBULATORY_CARE_PROVIDER_SITE_OTHER): Payer: 59 | Admitting: Cardiology

## 2018-02-21 ENCOUNTER — Encounter: Payer: Self-pay | Admitting: Cardiology

## 2018-02-21 VITALS — BP 149/98 | HR 69 | Ht 72.0 in | Wt 279.7 lb

## 2018-02-21 DIAGNOSIS — N529 Male erectile dysfunction, unspecified: Secondary | ICD-10-CM | POA: Diagnosis not present

## 2018-02-21 DIAGNOSIS — Z8774 Personal history of (corrected) congenital malformations of heart and circulatory system: Secondary | ICD-10-CM | POA: Diagnosis not present

## 2018-02-21 DIAGNOSIS — I10 Essential (primary) hypertension: Secondary | ICD-10-CM

## 2018-02-21 DIAGNOSIS — E669 Obesity, unspecified: Secondary | ICD-10-CM

## 2018-02-21 DIAGNOSIS — I1A0 Resistant hypertension: Secondary | ICD-10-CM

## 2018-02-21 DIAGNOSIS — I252 Old myocardial infarction: Secondary | ICD-10-CM | POA: Diagnosis not present

## 2018-02-21 MED ORDER — SPIRONOLACTONE 25 MG PO TABS: 25.0000 mg | ORAL_TABLET | Freq: Every day | ORAL | 3 refills | Status: DC

## 2018-02-21 NOTE — Progress Notes (Signed)
Subjective:   _0  ID@: Anthony Mcdaniel, male    DOB: [birth year], 48 y.o.   MRN: [id number]  [name], MD:  Chief Complaint  Patient presents with  . 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 He has had negative sleep study in 2014; however, has been referred back for repeat sleep study that is pending, no evidence of renal artery stenosis by CTA in 2018. Had embolic stroke, MRI 5/45/6256 revealed multiple numerous small supratentorial acute infarcts Patient underwent successful PFO repair on 06/12/2017.  Patient is here for follow-up for resistant hypertension.  He was started on Aldactone at his last office visit that he is tolerating well.  Kidney function has remained stable.  At his last office visit, he complained of erectile dysfunction despite Viagra use. As possible etiology for the ED, I have encouraged him to hold statin for 2 weeks so: however, he did not have improvement in symptoms with doing this and is now her seemed his Lipitor.  He does not notice significant erectile dysfunction, but states that he is not concerned about this at this time.  He denies any chest pain, dizziness, dyspnea, or leg edema.  He continues to work third shift 6 days a week and has not been able to start regular exercise to help with weight loss.  Reports gaining weight over the last few months.  He is considering changing jobs as he feels that it is too stressful with his medical conditions.  Past Medical History:  Diagnosis Date  . Hypercholesteremia   . Hypertension     Past Surgical History:  Procedure Laterality Date  . PATENT FORAMEN OVALE(PFO) CLOSURE N/A 06/12/2017   Procedure: PATENT FORAMEN OVALE (PFO) CLOSURE;  Surgeon: Adrian Prows, MD;  Location: [facility];  Service: Cardiovascular;  Laterality:  N/A;  . TEE WITHOUT CARDIOVERSION N/A 03/19/2017   Procedure: TRANSESOPHAGEAL ECHOCARDIOGRAM (TEE);  Surgeon: Nigel Mormon, MD;  Location: [facility] ENDOSCOPY;  Service: Cardiovascular;  Laterality: N/A;    Family History  Problem Relation Age of Onset  . Stroke Mother   . Kidney failure Father     Social History   Socioeconomic History  . Marital status: Married    Spouse name: Not on file  . Number of children: 2  . Years of education: Not on file  . Highest education level: Not on file  Occupational History  . Not on file  Social Needs  . Financial resource strain: Not on file  . Food insecurity:    Worry: Not on file    Inability: Not on file  . Transportation needs:    Medical: Not on file    Non-medical: Not on file  Tobacco Use  . Smoking status: Never Smoker  . Smokeless tobacco: Never Used  Substance and Sexual Activity  . Alcohol use: No  . Drug use: No  . Sexual activity: Not on file  Lifestyle  . Physical activity:    Days per week: Not on file    Minutes per session: Not on file  . Stress: Not on file  Relationships  . Social connections:    Talks on phone: Not on file    Gets together: Not on file    Attends religious service: Not on file    Active member of club  or organization: Not on file    Attends meetings of clubs or organizations: Not on file    Relationship status: Not on file  . Intimate partner violence:    Fear of current or ex partner: Not on file    Emotionally abused: Not on file    Physically abused: Not on file    Forced sexual activity: Not on file  Other Topics Concern  . Not on file  Social History Narrative  . Not on file    Current Outpatient Medications on File Prior to Visit  Medication Sig Dispense Refill  . amLODipine (NORVASC) 10 MG tablet Take 10 mg by mouth daily.    Marland Kitchen aspirin EC 81 MG tablet Take 81 mg by mouth daily.    Marland Kitchen atorvastatin (LIPITOR) 80 MG tablet Take 1 tablet (80 mg total) by mouth daily at 6 PM. 30  tablet 0  . clopidogrel (PLAVIX) 75 MG tablet Take 75 mg by mouth daily.    . irbesartan (AVAPRO) 300 MG tablet Take 300 mg by mouth daily.   3  . nebivolol (BYSTOLIC) 10 MG tablet Take 10 mg by mouth daily.     No current facility-administered medications on file prior to visit.      Review of Systems  Constitution: Positive for weight gain. Negative for decreased appetite, malaise/fatigue and weight loss.  Eyes: Negative for visual disturbance.  Cardiovascular: Negative for chest pain, dyspnea on exertion, leg swelling and palpitations.  Respiratory: Negative for hemoptysis and wheezing.   Endocrine: Negative for cold intolerance and heat intolerance.  Hematologic/Lymphatic: Does not bruise/bleed easily.  Skin: Negative for nail changes.  Musculoskeletal: Negative for myalgias.  Gastrointestinal: Positive for diarrhea (related to certain foods). Negative for abdominal pain, nausea and vomiting.  Neurological: Negative for difficulty with concentration, dizziness, focal weakness and headaches.  Psychiatric/Behavioral: Negative for altered mental status and suicidal ideas.  All other systems reviewed and are negative.      Objective:     Echocardiogram 08/28/2017: Left ventricle cavity is normal in size. Severe concentric hypertrophy of the left ventricle. Normal global wall motion. Doppler evidence of grade I (impaired) diastolic dysfunction, normal LAP. Calculated EF 67%. Left atrial cavity is severely dilated.   Interatrial septal occluder in place.  No significant valvular abnormality.  Inadequate tricuspid regurgitation jet to estimate pulmonary artery pressure. Normal right atrial pressure. Septal occluder new since last hospital echocardiogram in 03/2017.  Exercise myoview stress 04/06/2017:  1.  The patient performed treadmill exercise using a Bruce protocol, completing 7:44 minutes. The patient completed an estimated workload of 9.73 METS, reaching 87% of the maximum  predicted heart rate. Exercise capacity is normal for age. The patient did not develop symptoms other than fatigue during the procedure. Resting hypertension with normal blood pressure response at peak exercise. Peak BP 152/64 mmHg. Stress electrocardiogram is negative for ischemia.  2. The overall quality of the study is excellent. There is no evidence of abnormal lung activity. Stress and rest SPECT images demonstrate homogeneous tracer distribution throughout the myocardium. Gated SPECT imaging reveals normal myocardial thickening and wall motion. The left ventricular ejection fraction was normal (54%).   3. Low risk study.  CTA ABD and pelvis 04/26/2016: IMPRESSION: VASCULAR  1. Mild right renal artery ostial plaque without convincing high-grade stenosis. 2. Minimal aortic atherosclerosis without aneurysm or stenosis.  NON-VASCULAR  1. No acute findings  Renal artery duplex 04/21/2015: No evidence of renal artery occlusive disease in either renal artery. The ostium of  the renal arteries not well visualized due to bodily habitus.  Normal intrarenal vascular perfusion is noted in both kidneys. Dilated abdominal aorta suggestive of abdominal aortic aneurysm measuring 3cmx3cm. Consider rechecking abdominal aortic duplex in a year.  Labs 12/03/2017: Creatinine 1.34, EGFR 72, potassium 4.4, BMP otherwise normal.11/15/2017: Normal H&H, MCH 26.6, RDW 15.7%, CBC normal.  Creatinine 1.43, EGFR 64, potassium 3.9, CMP normal.  Cholesterol 129, triglycerides 79, HDL 43, LDL 70.  TSH normal.  Physical Exam  Constitutional: He is oriented to person, place, and time. Vital signs are normal. He appears well-developed and well-nourished.  HENT:  Head: Normocephalic and atraumatic.  Neck: Normal range of motion. Carotid bruit is present.  Cardiovascular: Normal rate, regular rhythm, normal heart sounds and intact distal pulses.  Pulmonary/Chest: Effort normal and breath sounds normal. No accessory muscle  usage. No respiratory distress.  Abdominal: Soft. Bowel sounds are normal.  Musculoskeletal: Normal range of motion.  Neurological: He is alert and oriented to person, place, and time.  Skin: Skin is warm and dry.  Vitals reviewed.       Assessment & Recommendations:   1. S/P percutaneous patent foramen ovale closure Doing well post-procedure. Occluder device was stable by echo performed in August 2019.   2. Resistant hypertension Continues to be uncontrolled, despite addition of aldactone. Will increase to 21m of Aldactone as his kidney function remained stable. Will repeat BMP in 10 days for close monitoring giving his CKD.   3. History of Cerebral embolic infarction He is now back on lipitor and would recommend continuing the same. He has residual deficit of occasional memory loss; however, has improved. Continue with DAPT.  4. Erectile Dysfunction Patient does not feel is a significant issue at this point and wishes to continue to use Viagra at this point.   5.Obesity Unfortunatley, he continues to gain weight. I have stressed the importance of weight loss with diet and exercise. Will reevaluate at next office visit.  I will see him back in 4 weeks for follow up on hypertension.      AJeri Lager FNP-C PR[facility]idianCardiovascular, PAtascocitaOffice: [phone]: [phone]

## 2018-02-22 ENCOUNTER — Encounter: Payer: Self-pay | Admitting: Cardiology

## 2018-03-04 ENCOUNTER — Other Ambulatory Visit: Payer: Self-pay | Admitting: Cardiology

## 2018-03-05 LAB — BASIC METABOLIC PANEL
BUN/Creatinine Ratio: 16 (ref 9–20)
BUN: 22 mg/dL (ref 6–24)
CO2: 22 mmol/L (ref 20–29)
CREATININE: 1.35 mg/dL — AB (ref 0.76–1.27)
Calcium: 9.3 mg/dL (ref 8.7–10.2)
Chloride: 103 mmol/L (ref 96–106)
GFR, EST AFRICAN AMERICAN: 72 mL/min/{1.73_m2} (ref >59)
GFR, EST NON AFRICAN AMERICAN: 62 mL/min/{1.73_m2} (ref >59)
Glucose: 133 mg/dL — ABNORMAL HIGH (ref 65–99)
Potassium: 4.3 mmol/L (ref 3.5–5.2)
Sodium: 141 mmol/L (ref 134–144)

## 2018-04-05 ENCOUNTER — Ambulatory Visit: Payer: 59 | Admitting: Cardiology

## 2018-05-06 ENCOUNTER — Other Ambulatory Visit: Payer: Self-pay

## 2018-05-06 ENCOUNTER — Encounter: Payer: Self-pay | Admitting: Cardiology

## 2018-05-06 ENCOUNTER — Ambulatory Visit (INDEPENDENT_AMBULATORY_CARE_PROVIDER_SITE_OTHER): Payer: 59 | Admitting: Cardiology

## 2018-05-06 VITALS — BP 139/95 | HR 64 | Ht 72.0 in | Wt 270.0 lb

## 2018-05-06 DIAGNOSIS — I10 Essential (primary) hypertension: Secondary | ICD-10-CM

## 2018-05-06 DIAGNOSIS — I129 Hypertensive chronic kidney disease with stage 1 through stage 4 chronic kidney disease, or unspecified chronic kidney disease: Secondary | ICD-10-CM | POA: Diagnosis not present

## 2018-05-06 DIAGNOSIS — N182 Chronic kidney disease, stage 2 (mild): Secondary | ICD-10-CM

## 2018-05-06 DIAGNOSIS — N529 Male erectile dysfunction, unspecified: Secondary | ICD-10-CM | POA: Diagnosis not present

## 2018-05-06 DIAGNOSIS — Z8774 Personal history of (corrected) congenital malformations of heart and circulatory system: Secondary | ICD-10-CM

## 2018-05-06 DIAGNOSIS — I252 Old myocardial infarction: Secondary | ICD-10-CM

## 2018-05-06 DIAGNOSIS — Z8673 Personal history of transient ischemic attack (TIA), and cerebral infarction without residual deficits: Secondary | ICD-10-CM

## 2018-05-06 MED ORDER — HYDRALAZINE HCL 25 MG PO TABS: 25.0000 mg | ORAL_TABLET | Freq: Two times a day (BID) | ORAL | 1 refills | Status: DC

## 2018-05-06 NOTE — Progress Notes (Signed)
Subjective:   _0  ID@: Anthony Mcdaniel, male    DOB: 16-Aug-1970, 48 y.o.   MRN: [id number]  Anthony Jordan, MD:  Chief Complaint  Patient presents with   Hypertension   Follow-up   This visit type was conducted due to national recommendations for restrictions regarding the COVID-19 Pandemic (e.g. social distancing).  This format is felt to be most appropriate for this patient at this time.  All issues noted in this document were discussed and addressed.  No physical exam was performed (except for noted visual exam findings with Telehealth visits).  The patient has consented to conduct a Telehealth visit and understands insurance will be billed.   I discussed the limitations of evaluation and management by telemedicine and the availability of in person appointments. The patient expressed understanding and agreed to proceed.  Virtual Visit via Video Note is as below  I connected with Anthony Mcdaniel, on 05/06/18 at 1300 by a video enabled telemedicine application and verified that I am speaking with the correct person using two identifiers.     I have discussed with her regarding the safety during COVID Pandemic and steps and precautions including social distancing with the patient.    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 He has had negative sleep study in 2014; however, has been referred back for repeat sleep study that has not been performed, no evidence of renal artery stenosis by CTA in 2018. Had embolic stroke, MRI 8/67/6195 revealed multiple numerous small supratentorial acute infarcts Patient underwent successful PFO repair on 06/12/2017.  This is a virtual visit for follow up on hypertension. Aldactone was increased at his last office visit that he is tolerating well.  Kidney function has remained stable.  He has history of erectile  dysfunction; however, has not noticed much of this recently. Did not improve with holding Lipitor. Uses as viagra as needed, but no recent use.  He denies any chest pain, dizziness, dyspnea, or leg edema. He does mention for the last few weeks having some left sided pain more towards his back that is present with laying a certain way or bending over. Does not remember injuring his back.  He continues to work third shift 6 days a week and has not been able to start regular exercise to help with weight loss.  He has been trying to lose weight with diet changes and reports losing about 10 lbs since last seen by me.    Past Medical History:  Diagnosis Date   Hypercholesteremia    Hypertension     Past Surgical History:  Procedure Laterality Date   PATENT FORAMEN OVALE(PFO) CLOSURE N/A 06/12/2017   Procedure: PATENT FORAMEN OVALE (PFO) CLOSURE;  Surgeon: Adrian Prows, MD;  Location: [facility];  Service: Cardiovascular;  Laterality: N/A;   TEE WITHOUT CARDIOVERSION N/A 03/19/2017   Procedure: TRANSESOPHAGEAL ECHOCARDIOGRAM (TEE);  Surgeon: Nigel Mormon, MD;  Location: [facility];  Service: Cardiovascular;  Laterality: N/A;    Family History  Problem Relation Age of Onset   Stroke Mother    Kidney failure Father     Social History   Socioeconomic History   Marital status: Married    Spouse name: Not on file   Number of children: 2   Years of education: Not on file   Highest education level: Not on file  Occupational History   Not  on file  Social Needs   Financial resource strain: Not on file   Food insecurity:    Worry: Not on file    Inability: Not on file   Transportation needs:    Medical: Not on file    Non-medical: Not on file  Tobacco Use   Smoking status: Never Smoker   Smokeless tobacco: Never Used  Substance and Sexual Activity   Alcohol use: No   Drug use: No   Sexual activity: Not on file  Lifestyle   Physical activity:    Days  per week: Not on file    Minutes per session: Not on file   Stress: Not on file  Relationships   Social connections:    Talks on phone: Not on file    Gets together: Not on file    Attends religious service: Not on file    Active member of club or organization: Not on file    Attends meetings of clubs or organizations: Not on file    Relationship status: Not on file   Intimate partner violence:    Fear of current or ex partner: Not on file    Emotionally abused: Not on file    Physically abused: Not on file    Forced sexual activity: Not on file  Other Topics Concern   Not on file  Social History Narrative   Not on file    Current Outpatient Medications on File Prior to Visit  Medication Sig Dispense Refill   amLODipine (NORVASC) 10 MG tablet Take 10 mg by mouth daily.     aspirin EC 81 MG tablet Take 81 mg by mouth daily.     atorvastatin (LIPITOR) 80 MG tablet Take 1 tablet (80 mg total) by mouth daily at 6 PM. 30 tablet 0   clopidogrel (PLAVIX) 75 MG tablet Take 75 mg by mouth daily.     irbesartan (AVAPRO) 300 MG tablet Take 300 mg by mouth daily.   3   nebivolol (BYSTOLIC) 10 MG tablet Take 10 mg by mouth daily.     spironolactone (ALDACTONE) 25 MG tablet Take 1 tablet (25 mg total) by mouth daily. 30 tablet 3   No current facility-administered medications on file prior to visit.      Review of Systems  Constitution: Positive for weight loss. Negative for decreased appetite and malaise/fatigue.  Eyes: Negative for visual disturbance.  Cardiovascular: Negative for chest pain, dyspnea on exertion, leg swelling and palpitations.  Respiratory: Negative for hemoptysis and wheezing.   Endocrine: Negative for cold intolerance and heat intolerance.  Hematologic/Lymphatic: Does not bruise/bleed easily.  Skin: Negative for nail changes.  Musculoskeletal: Negative for myalgias.  Gastrointestinal: Positive for diarrhea (related to certain foods). Negative for  abdominal pain, nausea and vomiting.  Neurological: Negative for difficulty with concentration, dizziness, focal weakness and headaches.  Psychiatric/Behavioral: Negative for altered mental status and suicidal ideas.  All other systems reviewed and are negative.      Objective:     Blood pressure (!) 139/95, pulse 64, height 6' (1.829 m), weight 270 lb (122.5 kg).    Echocardiogram 08/28/2017: Left ventricle cavity is normal in size. Severe concentric hypertrophy of the left ventricle. Normal global wall motion. Doppler evidence of grade I (impaired) diastolic dysfunction, normal LAP. Calculated EF 67%. Left atrial cavity is severely dilated.   Interatrial septal occluder in place.  No significant valvular abnormality.  Inadequate tricuspid regurgitation jet to estimate pulmonary artery pressure. Normal right atrial pressure. Septal occluder  new since last hospital echocardiogram in 03/2017.  Exercise myoview stress 04/06/2017:  1.  The patient performed treadmill exercise using a Bruce protocol, completing 7:44 minutes. The patient completed an estimated workload of 9.73 METS, reaching 87% of the maximum predicted heart rate. Exercise capacity is normal for age. The patient did not develop symptoms other than fatigue during the procedure. Resting hypertension with normal blood pressure response at peak exercise. Peak BP 152/64 mmHg. Stress electrocardiogram is negative for ischemia.  2. The overall quality of the study is excellent. There is no evidence of abnormal lung activity. Stress and rest SPECT images demonstrate homogeneous tracer distribution throughout the myocardium. Gated SPECT imaging reveals normal myocardial thickening and wall motion. The left ventricular ejection fraction was normal (54%).   3. Low risk study.  CTA ABD and pelvis 04/26/2016: IMPRESSION: VASCULAR  1. Mild right renal artery ostial plaque without convincing high-grade stenosis. 2. Minimal aortic  atherosclerosis without aneurysm or stenosis.  NON-VASCULAR  1. No acute findings  Renal artery duplex 04/21/2015: No evidence of renal artery occlusive disease in either renal artery. The ostium of the renal arteries not well visualized due to bodily habitus.  Normal intrarenal vascular perfusion is noted in both kidneys. Dilated abdominal aorta suggestive of abdominal aortic aneurysm measuring 3cmx3cm. Consider rechecking abdominal aortic duplex in a year.  Laboratory examination: BMP Latest Ref Rng & Units 03/04/2018 03/19/2017 03/19/2017  Glucose 65 - 99 mg/dL 133(H) 94 98  BUN 6 - 24 mg/dL _0 Creatinine 0.76 - 1.27 mg/dL 1.35(H) 1.68(H) 1.66(H)  BUN/Creat Ratio 9 - 20 16 - -  Sodium 134 - 144 mmol/L 141 141 139  Potassium 3.5 - 5.2 mmol/L 4.3 4.0 3.7  Chloride 96 - 106 mmol/L 103 107 106  CO2 20 - 29 mmol/L _1 Calcium 8.7 - 10.2 mg/dL 9.3 9.2 8.9    12/03/2017: Creatinine 1.34, EGFR 72, potassium 4.4, BMP otherwise normal.11/15/2017: Normal H&H, MCH 26.6, RDW 15.7%, CBC normal.  Creatinine 1.43, EGFR 64, potassium 3.9, CMP normal.  Cholesterol 129, triglycerides 79, HDL 43, LDL 70.  TSH normal.  Physical Exam  Constitutional: He is oriented to person, place, and time. Vital signs are normal. He appears well-developed and well-nourished.  HENT:  Head: Normocephalic and atraumatic.  Neck: Normal range of motion. Carotid bruit is present.  Cardiovascular: Normal rate, regular rhythm, normal heart sounds and intact distal pulses.  Pulmonary/Chest: Effort normal and breath sounds normal. No accessory muscle usage. No respiratory distress.  Abdominal: Soft. Bowel sounds are normal.  Musculoskeletal: Normal range of motion.  Neurological: He is alert and oriented to person, place, and time.  Skin: Skin is warm and dry.  Vitals reviewed.       Assessment & Recommendations:   1. Resistant hypertension Slightly improved with increasing Aldactone to 25 mg; however,  diastolic continues to be elevated. Has had negative renal artery duplex. Limited on increasing his medications in view of his CKD. I will start him on Hydralazine 77m twice a day and if he tolerates, will increase to 3 times a day. He is aware of possible interaction with viagra and advised not to use. It appears that he did not have sleep study performed, which may be contributing to his hypertension. Will discuss him having this performed at his next office visit once safe from COVID-19. Also had NSVT on previous event monitor. Continued efforts toward weight loss were discussed.  2. CKD (chronic kidney disease) stage 2, GFR  60-89 ml/min Remained stable. Will continue with current medications.   3. S/P percutaneous patent foramen ovale closure Performed in May 2019. Occluder in position by echo in August 2019. Felt to be etiology for CVA.    4. Erectile dysfunction, unspecified erectile dysfunction type Feels that this has improved and has not had to use any viagra. Will continue to monitor.   5. History of non-ST elevation myocardial infarction (NSTEMI) On appropriate medical therapy. Continue with aggressive risk factor modification.  6. History of cerebral embolic infarction Felt to be related to PFO. No reoccurrence of symptoms since this. On statin therapy.    Plan: I will schedule for follow up visit in 4 weeks to follow up on hypertension.    Jeri Lager, FNP-C [facility], [place] Office: [phone] Fax: [phone]

## 2018-06-03 ENCOUNTER — Encounter: Payer: Self-pay | Admitting: Cardiology

## 2018-06-03 ENCOUNTER — Other Ambulatory Visit: Payer: Self-pay

## 2018-06-03 ENCOUNTER — Ambulatory Visit (INDEPENDENT_AMBULATORY_CARE_PROVIDER_SITE_OTHER): Payer: 59 | Admitting: Cardiology

## 2018-06-03 VITALS — BP 133/93 | HR 64 | Ht 72.0 in | Wt 277.0 lb

## 2018-06-03 DIAGNOSIS — N182 Chronic kidney disease, stage 2 (mild): Secondary | ICD-10-CM

## 2018-06-03 DIAGNOSIS — N529 Male erectile dysfunction, unspecified: Secondary | ICD-10-CM | POA: Diagnosis not present

## 2018-06-03 DIAGNOSIS — Z8774 Personal history of (corrected) congenital malformations of heart and circulatory system: Secondary | ICD-10-CM | POA: Diagnosis not present

## 2018-06-03 DIAGNOSIS — I129 Hypertensive chronic kidney disease with stage 1 through stage 4 chronic kidney disease, or unspecified chronic kidney disease: Secondary | ICD-10-CM | POA: Diagnosis not present

## 2018-06-03 DIAGNOSIS — I10 Essential (primary) hypertension: Secondary | ICD-10-CM

## 2018-06-03 DIAGNOSIS — Z8673 Personal history of transient ischemic attack (TIA), and cerebral infarction without residual deficits: Secondary | ICD-10-CM

## 2018-06-03 DIAGNOSIS — I252 Old myocardial infarction: Secondary | ICD-10-CM

## 2018-06-03 MED ORDER — HYDRALAZINE HCL 25 MG PO TABS: 25.0000 mg | ORAL_TABLET | Freq: Three times a day (TID) | ORAL | 3 refills | Status: DC

## 2018-06-03 NOTE — Progress Notes (Signed)
Subjective:   '@Patient'  ID@: Anthony Mcdaniel, male    DOB: May 24, 1970, 48 y.o.   MRN: [id number]  Anthony Jordan, MD:  Chief Complaint  Patient presents with  . Hypertension  . Follow-up   This visit type was conducted due to national recommendations for restrictions regarding the COVID-19 Pandemic (e.g. social distancing).  This format is felt to be most appropriate for this patient at this time.  All issues noted in this document were discussed and addressed.  No physical exam was performed (except for noted visual exam findings with Telehealth visits).  The patient has consented to conduct a Telehealth visit and understands insurance will be billed.   I discussed the limitations of evaluation and management by telemedicine and the availability of in person appointments. The patient expressed understanding and agreed to proceed.  Virtual Visit via Video Note is as below  I connected with Anthony Mcdaniel, on 06/03/18 at 1400 by a video enabled telemedicine application and verified that I am speaking with the correct person using two identifiers.     I have discussed with her regarding the safety during COVID Pandemic and steps and precautions including social distancing with the patient.    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 He has had negative sleep study in 2014; however, has been referred back for repeat sleep study that has not been performed, no evidence of renal artery stenosis by CTA in 2018. Had embolic stroke, MRI 0/81/3887 revealed multiple numerous small supratentorial acute infarcts Patient underwent successful PFO repair on 06/12/2017.  This is a virtual visit for follow up on hypertension. Started on hydralazine 25 mg twice a day at his last office visit that he is tolerating well. BP has improved to 195 systolic and low 97'I  diastolic; however, does continue to have some fluctuations with diastolic readings in the 71'E. He  He has history of erectile dysfunction; however, has not noticed much of this recently. Did not improve with holding Lipitor. Uses as viagra as needed, but no recent use.  He denies any chest pain, dizziness, dyspnea, or leg edema.  He continues to work third shift 6 days a week and has recently started back exercising. Tolerating this well.  Past Medical History:  Diagnosis Date  . Hypercholesteremia   . Hypertension     Past Surgical History:  Procedure Laterality Date  . PATENT FORAMEN OVALE(PFO) CLOSURE N/A 06/12/2017   Procedure: PATENT FORAMEN OVALE (PFO) CLOSURE;  Surgeon: Adrian Prows, MD;  Location: [facility];  Service: Cardiovascular;  Laterality: N/A;  . TEE WITHOUT CARDIOVERSION N/A 03/19/2017   Procedure: TRANSESOPHAGEAL ECHOCARDIOGRAM (TEE);  Surgeon: Nigel Mormon, MD;  Location: [facility] ENDOSCOPY;  Service: Cardiovascular;  Laterality: N/A;    Family History  Problem Relation Age of Onset  . Stroke Mother   . Kidney failure Father     Social History   Socioeconomic History  . Marital status: Married    Spouse name: Not on file  . Number of children: 2  . Years of education: Not on file  . Highest education level: Not on file  Occupational History  . Not on file  Social Needs  . Financial resource strain: Not on file  . Food insecurity:    Worry: Not on file    Inability: Not on file  . Transportation needs:    Medical: Not  on file    Non-medical: Not on file  Tobacco Use  . Smoking status: Never Smoker  . Smokeless tobacco: Never Used  Substance and Sexual Activity  . Alcohol use: No  . Drug use: No  . Sexual activity: Not on file  Lifestyle  . Physical activity:    Days per week: Not on file    Minutes per session: Not on file  . Stress: Not on file  Relationships  . Social connections:    Talks on phone: Not on file    Gets together: Not  on file    Attends religious service: Not on file    Active member of club or organization: Not on file    Attends meetings of clubs or organizations: Not on file    Relationship status: Not on file  . Intimate partner violence:    Fear of current or ex partner: Not on file    Emotionally abused: Not on file    Physically abused: Not on file    Forced sexual activity: Not on file  Other Topics Concern  . Not on file  Social History Narrative  . Not on file    Current Outpatient Medications on File Prior to Visit  Medication Sig Dispense Refill  . amLODipine (NORVASC) 10 MG tablet Take 10 mg by mouth daily.    Marland Kitchen aspirin EC 81 MG tablet Take 81 mg by mouth daily.    Marland Kitchen atorvastatin (LIPITOR) 80 MG tablet Take 1 tablet (80 mg total) by mouth daily at 6 PM. 30 tablet 0  . clopidogrel (PLAVIX) 75 MG tablet Take 75 mg by mouth daily.    . irbesartan (AVAPRO) 300 MG tablet Take 300 mg by mouth daily.   3  . nebivolol (BYSTOLIC) 10 MG tablet Take 10 mg by mouth daily.    . sildenafil (VIAGRA) 100 MG tablet Take 100 mg by mouth daily as needed for erectile dysfunction.    Marland Kitchen spironolactone (ALDACTONE) 25 MG tablet Take 1 tablet (25 mg total) by mouth daily. 30 tablet 3   No current facility-administered medications on file prior to visit.      Review of Systems  Constitution: Positive for weight loss. Negative for decreased appetite and malaise/fatigue.  Eyes: Negative for visual disturbance.  Cardiovascular: Negative for chest pain, dyspnea on exertion, leg swelling and palpitations.  Respiratory: Negative for hemoptysis and wheezing.   Endocrine: Negative for cold intolerance and heat intolerance.  Hematologic/Lymphatic: Does not bruise/bleed easily.  Skin: Negative for nail changes.  Musculoskeletal: Negative for myalgias.  Gastrointestinal: Positive for diarrhea (related to certain foods). Negative for abdominal pain, nausea and vomiting.  Neurological: Negative for difficulty with  concentration, dizziness, focal weakness and headaches.  Psychiatric/Behavioral: Negative for altered mental status and suicidal ideas.  All other systems reviewed and are negative.      Objective:     Blood pressure (!) 133/93, pulse 64, height 6' (1.829 m), weight 277 lb (125.6 kg).    Echocardiogram 08/28/2017: Left ventricle cavity is normal in size. Severe concentric hypertrophy of the left ventricle. Normal global wall motion. Doppler evidence of grade I (impaired) diastolic dysfunction, normal LAP. Calculated EF 67%. Left atrial cavity is severely dilated.   Interatrial septal occluder in place.  No significant valvular abnormality.  Inadequate tricuspid regurgitation jet to estimate pulmonary artery pressure. Normal right atrial pressure. Septal occluder new since last hospital echocardiogram in 03/2017.  Exercise myoview stress 04/06/2017:  1.  The patient performed treadmill exercise  using a Bruce protocol, completing 7:44 minutes. The patient completed an estimated workload of 9.73 METS, reaching 87% of the maximum predicted heart rate. Exercise capacity is normal for age. The patient did not develop symptoms other than fatigue during the procedure. Resting hypertension with normal blood pressure response at peak exercise. Peak BP 152/64 mmHg. Stress electrocardiogram is negative for ischemia.  2. The overall quality of the study is excellent. There is no evidence of abnormal lung activity. Stress and rest SPECT images demonstrate homogeneous tracer distribution throughout the myocardium. Gated SPECT imaging reveals normal myocardial thickening and wall motion. The left ventricular ejection fraction was normal (54%).   3. Low risk study.  CTA ABD and pelvis 04/26/2016: IMPRESSION: VASCULAR  1. Mild right renal artery ostial plaque without convincing high-grade stenosis. 2. Minimal aortic atherosclerosis without aneurysm or stenosis.  NON-VASCULAR  1. No acute findings   Renal artery duplex 04/21/2015: No evidence of renal artery occlusive disease in either renal artery. The ostium of the renal arteries not well visualized due to bodily habitus.  Normal intrarenal vascular perfusion is noted in both kidneys. Dilated abdominal aorta suggestive of abdominal aortic aneurysm measuring 3cmx3cm. Consider rechecking abdominal aortic duplex in a year.  Laboratory examination: BMP Latest Ref Rng & Units 03/04/2018 03/19/2017 03/19/2017  Glucose 65 - 99 mg/dL 133(H) 94 98  BUN 6 - 24 mg/dL '22 19 20  ' Creatinine 0.76 - 1.27 mg/dL 1.35(H) 1.68(H) 1.66(H)  BUN/Creat Ratio 9 - 20 16 - -  Sodium 134 - 144 mmol/L 141 141 139  Potassium 3.5 - 5.2 mmol/L 4.3 4.0 3.7  Chloride 96 - 106 mmol/L 103 107 106  CO2 20 - 29 mmol/L '22 23 22  ' Calcium 8.7 - 10.2 mg/dL 9.3 9.2 8.9    12/03/2017: Creatinine 1.34, EGFR 72, potassium 4.4, BMP otherwise normal.11/15/2017: Normal H&H, MCH 26.6, RDW 15.7%, CBC normal.  Creatinine 1.43, EGFR 64, potassium 3.9, CMP normal.  Cholesterol 129, triglycerides 79, HDL 43, LDL 70.  TSH normal.  Physical exam not performed or limited due to virtual visit.  Patient appeared to be in no distress, Neck was supple, respiration was not labored.  Please see exam details from prior visit is as below.   Physical Exam  Constitutional: He is oriented to person, place, and time. Vital signs are normal. He appears well-developed and well-nourished.  HENT:  Head: Normocephalic and atraumatic.  Neck: Normal range of motion. Carotid bruit is present.  Cardiovascular: Normal rate, regular rhythm, normal heart sounds and intact distal pulses.  Pulmonary/Chest: Effort normal and breath sounds normal. No accessory muscle usage. No respiratory distress.  Abdominal: Soft. Bowel sounds are normal.  Musculoskeletal: Normal range of motion.  Neurological: He is alert and oriented to person, place, and time.  Skin: Skin is warm and dry.  Vitals reviewed.        Assessment & Recommendations:   1. Resistant hypertension BP improved with Hydralazine; however, still occasionally fluctuating and is elevated today. Will increase up Hydralazine to 3 times a day. Suspect his weight and possibly underlying sleep apnea is contributing to his resistant hypertension. I have urged him to contact Dr. Guadelupe Sabin office regarding scheduling his sleep study. Urged him to make diet changes to help with weight loss, he has started exercising.   2. CKD (chronic kidney disease) stage 2, GFR 60-89 ml/min Remained stable. Will continue with current medications.   3. S/P percutaneous patent foramen ovale closure Performed in May 2019. Occluder in position by echo  in August 2019. Felt to be etiology for CVA.    4. Erectile dysfunction, unspecified erectile dysfunction type Has not had further issues with this.   5. History of non-ST elevation myocardial infarction (NSTEMI) On appropriate medical therapy. Continue with aggressive risk factor modification.  6. History of cerebral embolic infarction Felt to be related to PFO. No reoccurrence of symptoms since this. On statin therapy.    Plan: I will schedule for follow up visit in 3 months to follow up on hypertension.    Anthony Lager, FNP-C [facility], [place] Office: [phone] Fax: [phone]

## 2018-06-09 ENCOUNTER — Other Ambulatory Visit: Payer: Self-pay | Admitting: Cardiology

## 2018-06-12 ENCOUNTER — Other Ambulatory Visit: Payer: Self-pay

## 2018-06-13 MED ORDER — AMLODIPINE BESYLATE 10 MG PO TABS: 10.0000 mg | ORAL_TABLET | Freq: Every day | ORAL | 1 refills | Status: DC

## 2018-07-04 ENCOUNTER — Other Ambulatory Visit: Payer: Self-pay | Admitting: Cardiology

## 2018-07-18 ENCOUNTER — Other Ambulatory Visit: Payer: Self-pay | Admitting: Cardiology

## 2018-07-18 NOTE — Telephone Encounter (Signed)
Please fill

## 2018-07-23 ENCOUNTER — Other Ambulatory Visit: Payer: Self-pay

## 2018-07-23 MED ORDER — IRBESARTAN 300 MG PO TABS: 300.0000 mg | ORAL_TABLET | Freq: Every day | ORAL | 1 refills | Status: DC

## 2018-08-25 ENCOUNTER — Other Ambulatory Visit: Payer: Self-pay | Admitting: Cardiology

## 2018-08-26 NOTE — Telephone Encounter (Signed)
Please fill

## 2018-09-05 ENCOUNTER — Other Ambulatory Visit: Payer: Self-pay

## 2018-09-05 DIAGNOSIS — E785 Hyperlipidemia, unspecified: Secondary | ICD-10-CM

## 2018-09-05 MED ORDER — ATORVASTATIN CALCIUM 80 MG PO TABS: 80.0000 mg | ORAL_TABLET | Freq: Every day | ORAL | 0 refills | Status: DC

## 2018-09-06 ENCOUNTER — Ambulatory Visit: Payer: 59 | Admitting: Cardiology

## 2018-09-13 ENCOUNTER — Other Ambulatory Visit: Payer: Self-pay

## 2018-09-13 ENCOUNTER — Ambulatory Visit (INDEPENDENT_AMBULATORY_CARE_PROVIDER_SITE_OTHER): Payer: 59 | Admitting: Cardiology

## 2018-09-13 ENCOUNTER — Encounter: Payer: Self-pay | Admitting: Cardiology

## 2018-09-13 VITALS — BP 147/100 | HR 62 | Ht 71.0 in | Wt 296.4 lb

## 2018-09-13 DIAGNOSIS — Z8774 Personal history of (corrected) congenital malformations of heart and circulatory system: Secondary | ICD-10-CM | POA: Diagnosis not present

## 2018-09-13 DIAGNOSIS — I252 Old myocardial infarction: Secondary | ICD-10-CM

## 2018-09-13 DIAGNOSIS — E669 Obesity, unspecified: Secondary | ICD-10-CM

## 2018-09-13 DIAGNOSIS — I129 Hypertensive chronic kidney disease with stage 1 through stage 4 chronic kidney disease, or unspecified chronic kidney disease: Secondary | ICD-10-CM | POA: Diagnosis not present

## 2018-09-13 DIAGNOSIS — Z8673 Personal history of transient ischemic attack (TIA), and cerebral infarction without residual deficits: Secondary | ICD-10-CM

## 2018-09-13 DIAGNOSIS — N182 Chronic kidney disease, stage 2 (mild): Secondary | ICD-10-CM

## 2018-09-13 DIAGNOSIS — I10 Essential (primary) hypertension: Secondary | ICD-10-CM

## 2018-09-13 MED ORDER — HYDRALAZINE HCL 50 MG PO TABS: 50.0000 mg | ORAL_TABLET | Freq: Three times a day (TID) | ORAL | 3 refills | Status: DC

## 2018-09-13 NOTE — Progress Notes (Signed)
Primary Physician:  Mila PalmerWolters, Sharon, MD   Patient ID: Anthony CiscoKevin Seibold, male    DOB: 11/20/1970, 48 y.o.   MRN: [id number]  Subjective:    Chief Complaint  Patient presents with  . Hypertension  . Follow-up    HPI: Anthony CiscoKevin Mcdaniel  is a 48 y.o. 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 He has had negative sleep study in 2014; however, has been referred back for repeat sleep study that has not been performed, no evidence of renal artery stenosis by CTA in 2018. Had embolic stroke, MRI 03/15/2017 revealed multiple numerous small supratentorial acute infarcts Patient underwent successful PFO repair on 06/12/2017.  This is a 3 month follow up for hypertension. Hydralazine was increased to 3 times a day and now presents for follow up. He has history of erectile dysfunction; however, has not noticed much of this recently. Did not improve with holding Lipitor. Uses as viagra as needed, but no recent use.  He denies any chest pain, dizziness, dyspnea, or leg edema.  He continues to work third shift 6 days a week. He does admit to gaining weight since the pandemic due to poor dietary compliance and not exercising.   Past Medical History:  Diagnosis Date  . Hypercholesteremia   . Hypertension     Past Surgical History:  Procedure Laterality Date  . PATENT FORAMEN OVALE(PFO) CLOSURE N/A 06/12/2017   Procedure: PATENT FORAMEN OVALE (PFO) CLOSURE;  Surgeon: Yates DecampGanji, Jay, MD;  Location: [facility];  Service: Cardiovascular;  Laterality: N/A;  . TEE WITHOUT CARDIOVERSION N/A 03/19/2017   Procedure: TRANSESOPHAGEAL ECHOCARDIOGRAM (TEE);  Surgeon: Elder NegusPatwardhan, Manish J, MD;  [facility];  Service: Cardiovascular;  Laterality: N/A;    Social History   Socioeconomic History  . Marital status: Married    Spouse name: Not on file  . Number of children: 2  . Years of education: Not on file  .  Highest education level: Not on file  Occupational History  . Not on file  Social Needs  . Financial resource strain: Not on file  . Food insecurity    Worry: Not on file    Inability: Not on file  . Transportation needs    Medical: Not on file    Non-medical: Not on file  Tobacco Use  . Smoking status: Never Smoker  . Smokeless tobacco: Never Used  Substance and Sexual Activity  . Alcohol use: No  . Drug use: No  . Sexual activity: Not on file  Lifestyle  . Physical activity    Days per week: Not on file    Minutes per session: Not on file  . Stress: Not on file  Relationships  . Social Musicianconnections    Talks on phone: Not on file    Gets together: Not on file    Attends religious service: Not on file    Active member of club or organization: Not on file    Attends meetings of clubs or organizations: Not on file    Relationship status: Not on file  . Intimate partner violence    Fear of current or ex partner: Not on file    Emotionally abused: Not on file    Physically abused: Not on file    Forced sexual activity: Not on file  Other Topics Concern  . Not on file  Social History Narrative  . Not on file    Review of Systems  Constitution: Negative for decreased appetite, malaise/fatigue, weight gain and weight loss.  Eyes: Negative for visual disturbance.  Cardiovascular: Negative for chest pain, claudication, dyspnea on exertion, leg swelling, orthopnea, palpitations and syncope.  Respiratory: Negative for hemoptysis and wheezing.   Endocrine: Negative for cold intolerance and heat intolerance.  Hematologic/Lymphatic: Does not bruise/bleed easily.  Skin: Negative for nail changes.  Musculoskeletal: Negative for muscle weakness and myalgias.  Gastrointestinal: Positive for diarrhea (certain foods). Negative for abdominal pain, change in bowel habit, nausea and vomiting.  Neurological: Negative for difficulty with concentration, dizziness, focal weakness and  headaches.  Psychiatric/Behavioral: Negative for altered mental status and suicidal ideas.  All other systems reviewed and are negative.     Objective:  Blood pressure (!) 147/100, pulse 62, height 5\' 11"  (1.803 m), weight 296 lb 6.4 oz (134.4 kg), SpO2 95 %. Body mass index is 41.34 kg/m.    Physical Exam  Constitutional: He is oriented to person, place, and time. Vital signs are normal. He appears well-developed and well-nourished.  HENT:  Head: Normocephalic and atraumatic.  Neck: Normal range of motion.  Cardiovascular: Normal rate, regular rhythm, normal heart sounds and intact distal pulses.  Pulmonary/Chest: Effort normal and breath sounds normal. No accessory muscle usage. No respiratory distress.  Abdominal: Soft. Bowel sounds are normal.  Musculoskeletal: Normal range of motion.  Neurological: He is alert and oriented to person, place, and time.  Skin: Skin is warm and dry.  Vitals reviewed.  Radiology: No results found.  Laboratory examination:    CMP Latest Ref Rng & Units 03/04/2018 03/19/2017 03/19/2017  Glucose 65 - 99 mg/dL 133(H) 94 98  BUN 6 - 24 mg/dL 22 19 20   Creatinine 0.76 - 1.27 mg/dL 1.35(H) 1.68(H) 1.66(H)  Sodium 134 - 144 mmol/L 141 141 139  Potassium 3.5 - 5.2 mmol/L 4.3 4.0 3.7  Chloride 96 - 106 mmol/L 103 107 106  CO2 20 - 29 mmol/L 22 23 22   Calcium 8.7 - 10.2 mg/dL 9.3 9.2 8.9  Total Protein 6.5 - 8.1 g/dL - - -  Total Bilirubin 0.3 - 1.2 mg/dL - - -  Alkaline Phos 38 - 126 U/L - - -  AST 15 - 41 U/L - - -  ALT 17 - 63 U/L - - -   CBC Latest Ref Rng & Units 03/19/2017 03/18/2017 03/16/2017  WBC 4.0 - 10.5 K/uL 7.0 6.1 6.9  Hemoglobin 13.0 - 17.0 g/dL 10.5(L) 10.0(L) 10.6(L)  Hematocrit 39.0 - 52.0 % 33.4(L) 30.7(L) 32.8(L)  Platelets 150 - 400 K/uL 390 329 315   Lipid Panel     Component Value Date/Time   CHOL 129 03/19/2017 1659   TRIG 187 (H) 03/19/2017 1659   HDL 23 (L) 03/19/2017 1659   CHOLHDL 5.6 03/19/2017 1659   VLDL 37 03/19/2017  1659   LDLCALC 69 03/19/2017 1659   HEMOGLOBIN A1C Lab Results  Component Value Date   HGBA1C 5.7 (H) 03/18/2017   MPG 116.89 03/18/2017   TSH No results for input(s): TSH in the last 8760 hours.  PRN Meds:. There are no discontinued medications. Current Meds  Medication Sig  . amLODipine (NORVASC) 10 MG tablet Take 1 tablet (10 mg total) by mouth daily.  Marland Kitchen aspirin EC 81 MG tablet Take 81 mg by mouth daily.  Marland Kitchen atorvastatin (LIPITOR) 80 MG tablet Take 1 tablet (80 mg total) by mouth daily at 6 PM.  . BYSTOLIC 10 MG tablet TAKE 1 TABLET BY MOUTH DAILY  . clopidogrel (PLAVIX)  75 MG tablet TAKE 1 TABLET BY MOUTH EVERY DAY  . irbesartan (AVAPRO) 300 MG tablet Take 1 tablet (300 mg total) by mouth daily.  . sildenafil (VIAGRA) 100 MG tablet Take 100 mg by mouth daily as needed for erectile dysfunction.  Marland Kitchen spironolactone (ALDACTONE) 25 MG tablet TAKE 1 TABLET BY MOUTH DAILY    Cardiac Studies:   Echocardiogram 08/28/2017: Left ventricle cavity is normal in size. Severe concentric hypertrophy of the left ventricle. Normal global wall motion. Doppler evidence of grade I (impaired) diastolic dysfunction, normal LAP. Calculated EF 67%. Left atrial cavity is severely dilated.  Interatrial septal occluder in place.  No significant valvular abnormality.  Inadequate tricuspid regurgitation jet to estimate pulmonary artery pressure. Normal right atrial pressure. Septal occluder new since last hospital echocardiogram in 03/2017.  Exercise myoview stress 04/06/2017:  1.  The patient performed treadmill exercise using a Bruce protocol, completing 7:44 minutes. The patient completed an estimated workload of 9.73 METS, reaching 87% of the maximum predicted heart rate. Exercise capacity is normal for age. The patient did not develop symptoms other than fatigue during the procedure. Resting hypertension with normal blood pressure response at peak exercise. Peak BP 152/64 mmHg. Stress  electrocardiogram is negative for ischemia.  2. The overall quality of the study is excellent. There is no evidence of abnormal lung activity. Stress and rest SPECT images demonstrate homogeneous tracer distribution throughout the myocardium. Gated SPECT imaging reveals normal myocardial thickening and wall motion. The left ventricular ejection fraction was normal (54%).   3. Low risk study.  CTA ABD and pelvis 04/26/2016: IMPRESSION: VASCULAR 1. Mild right renal artery ostial plaque without convincing high-grade stenosis. 2. Minimal aortic atherosclerosis without aneurysm or stenosis. NON-VASCULAR 1. No acute findings  Renal artery duplex 04/21/2015: No evidence of renal artery occlusive disease in either renal artery. The ostium of the renal arteries not well visualized due to bodily habitus.  Normal intrarenal vascular perfusion is noted in both kidneys. Dilated abdominal aorta suggestive of abdominal aortic aneurysm measuring 3cmx3cm. Consider rechecking abdominal aortic duplex in a year.  Assessment:   Resistant hypertension - Plan: EKG 12-Lead  CKD (chronic kidney disease) stage 2, GFR 60-89 ml/min  S/P percutaneous patent foramen ovale closure  History of non-ST elevation myocardial infarction (NSTEMI)  History of cerebral embolic infarction  Obesity (BMI 30-39.9)  EKG 09/13/2018: Sinus bradycardia at 58 bpm with first degree AV block, left atrial abnormality, LVH with repolarization, cannot exclude inferior and lateral ischemia.  Recommendations:   Patient is here for 3 month follow up for hypertension. He unfortunately, has gained approximately 25 pounds since the start of the pain.  He admits to being noncompliant with his diet and not exercising.  Previously his blood pressure had improved, but is now again uncontrolled.  Feel that this is purely related to his lifestyle.  I have counseled him on the importance of making lifestyle changes to help with controlling  this.  I will further increase his hydralazine to 50 mg 3 times daily.  His kidney function has remained stable on last check, will need to continue to closely monitor.  He has not had any chest discomfort or shortness of breath, overall is feeling well.  No recurrent symptoms suggestive of stroke or TIA.  He has been evaluated by sleep medicine, and sleep study was recommended; however, patient has not followed up with this.  I urged him to follow back up as I suspect his resistant hypertension is being contributed by possible  underlying sleep apnea as well given daytime fatigue, severe left atrial dilation and resistant hypertension.  Previous echocardiogram in August 2019 showed stable PFO occluder.  Will consider repeating this at his next office visit.  I will see him back in 8 weeks for close monitoring and follow-up on hypertension.  Toniann FailAshton Haynes Faline Langer, MSN, APRN, FNP-C [facility] Office: [phone] Fax: [phone]

## 2018-10-16 ENCOUNTER — Other Ambulatory Visit: Payer: Self-pay

## 2018-10-16 DIAGNOSIS — E785 Hyperlipidemia, unspecified: Secondary | ICD-10-CM

## 2018-10-16 MED ORDER — ATORVASTATIN CALCIUM 80 MG PO TABS: 80.0000 mg | ORAL_TABLET | Freq: Every day | ORAL | 0 refills | Status: DC

## 2018-10-30 ENCOUNTER — Other Ambulatory Visit: Payer: Self-pay

## 2018-10-30 MED ORDER — CLOPIDOGREL BISULFATE 75 MG PO TABS: 75.0000 mg | ORAL_TABLET | Freq: Every day | ORAL | 1 refills | Status: DC

## 2018-11-05 ENCOUNTER — Telehealth: Payer: Self-pay

## 2018-12-03 ENCOUNTER — Other Ambulatory Visit: Payer: Self-pay

## 2018-12-03 MED ORDER — AMLODIPINE BESYLATE 10 MG PO TABS: 10.0000 mg | ORAL_TABLET | Freq: Every day | ORAL | 1 refills | Status: DC

## 2018-12-03 NOTE — Telephone Encounter (Signed)
I called the pt and lvm telling him to call us when he is running out of meds and we will switch to coreg.

## 2018-12-03 NOTE — Telephone Encounter (Signed)
The patient called and said that as of January 17, 2019 his insurance and pharmacy will not carry/cover his bystolic anymore. He asked for an alternative. Thanks

## 2018-12-04 MED ORDER — CARVEDILOL 12.5 MG PO TABS: 12.5000 mg | ORAL_TABLET | Freq: Two times a day (BID) | ORAL | 3 refills | Status: DC

## 2019-01-07 ENCOUNTER — Other Ambulatory Visit: Payer: Self-pay | Admitting: Cardiology

## 2019-02-06 ENCOUNTER — Other Ambulatory Visit: Payer: Self-pay | Admitting: Cardiology

## 2019-02-19 ENCOUNTER — Other Ambulatory Visit: Payer: Self-pay | Admitting: Cardiology

## 2019-02-19 DIAGNOSIS — I10 Essential (primary) hypertension: Secondary | ICD-10-CM

## 2019-05-22 ENCOUNTER — Other Ambulatory Visit: Payer: Self-pay | Admitting: Cardiology

## 2019-07-23 ENCOUNTER — Other Ambulatory Visit: Payer: Self-pay | Admitting: Cardiology

## 2019-08-20 ENCOUNTER — Other Ambulatory Visit: Payer: Self-pay | Admitting: Cardiology

## 2019-08-21 ENCOUNTER — Other Ambulatory Visit: Payer: Self-pay | Admitting: Cardiology

## 2019-08-21 DIAGNOSIS — E785 Hyperlipidemia, unspecified: Secondary | ICD-10-CM

## 2019-09-01 ENCOUNTER — Other Ambulatory Visit: Payer: Self-pay

## 2019-09-01 DIAGNOSIS — E785 Hyperlipidemia, unspecified: Secondary | ICD-10-CM

## 2019-09-01 MED ORDER — ATORVASTATIN CALCIUM 80 MG PO TABS: 80.0000 mg | ORAL_TABLET | Freq: Every day | ORAL | 0 refills | Status: DC

## 2019-09-15 ENCOUNTER — Other Ambulatory Visit: Payer: Self-pay

## 2019-09-15 ENCOUNTER — Ambulatory Visit (INDEPENDENT_AMBULATORY_CARE_PROVIDER_SITE_OTHER): Payer: 59 | Admitting: Cardiology

## 2019-09-15 ENCOUNTER — Encounter: Payer: Self-pay | Admitting: Cardiology

## 2019-09-15 VITALS — BP 142/95 | HR 81 | Resp 17 | Ht 71.0 in | Wt 308.0 lb

## 2019-09-15 DIAGNOSIS — E782 Mixed hyperlipidemia: Secondary | ICD-10-CM

## 2019-09-15 DIAGNOSIS — Z6841 Body Mass Index (BMI) 40.0 and over, adult: Secondary | ICD-10-CM

## 2019-09-15 DIAGNOSIS — I10 Essential (primary) hypertension: Secondary | ICD-10-CM

## 2019-09-15 DIAGNOSIS — Z8673 Personal history of transient ischemic attack (TIA), and cerebral infarction without residual deficits: Secondary | ICD-10-CM

## 2019-09-15 DIAGNOSIS — I252 Old myocardial infarction: Secondary | ICD-10-CM

## 2019-09-15 DIAGNOSIS — Z8774 Personal history of (corrected) congenital malformations of heart and circulatory system: Secondary | ICD-10-CM

## 2019-09-15 NOTE — Progress Notes (Signed)
Anthony Mcdaniel Date of Birth: August 22, 1970 MRN: [id number] Primary Care Provider:Wolters, Ivin Booty, MD Former Cardiology Providers: Jeri Lager, APRN, FNP-C  Primary Cardiologist: Rex Kras, DO, [facility] (established care 09/15/2019)  Date: 09/15/19 Last Visit: 09/13/2018  Chief Complaint  Patient presents with  . Hypertension  . Follow-up    HPI  Anthony Mcdaniel is a 49 y.o.  male who presents to the office with a chief complaint of " blood pressure management." Patient's past medical history and cardiovascular risk factors include: Resistant hypertension, hyperlipidemia, history of stroke, status post PFO closure, obesity due to excess calories.  Patient was last seen in August 2020 by Jeri Lager and now is here to reestablish care for management of his high blood pressure.  Patient states that his home blood pressures are not well controlled with systolic blood pressures ranging between 829-937 mmHg and diastolic blood pressures range between 75-80 mmHg.  Since last office visit patient denies any hospitalizations or urgent care visits for cardiovascular symptoms.  At the last office visit was recommended to have another sleep study done but he forgot to have this completed.  Patient had an embolic stroke and based on the MRI dated 03/07/2017 he had multiple numerous small supratentorial acute infarcts.  Patient was found to have a PFO which was successfully repaired on Jun 12, 2017.  History of NSTEMI, Stroke. Denies prior history of congestive heart failure, deep venous thrombosis, pulmonary embolism, transient ischemic attack.  FUNCTIONAL STATUS: No structured exercise program or daily routine.   ALLERGIES: No Known Allergies   MEDICATION LIST PRIOR TO VISIT: Current Outpatient Medications on File Prior to Visit  Medication Sig Dispense Refill  . amLODipine (NORVASC) 10 MG tablet TAKE 1 TABLET(10 MG) BY MOUTH DAILY 90 tablet 1  . aspirin EC 81 MG tablet Take 81 mg by mouth  daily.    Marland Kitchen atorvastatin (LIPITOR) 80 MG tablet Take 1 tablet (80 mg total) by mouth daily at 6 PM. 30 tablet 0  . carvedilol (COREG) 12.5 MG tablet Take 1 tablet (12.5 mg total) by mouth 2 (two) times daily. 180 tablet 3  . clopidogrel (PLAVIX) 75 MG tablet TAKE 1 TABLET(75 MG) BY MOUTH DAILY 90 tablet 1  . hydrALAZINE (APRESOLINE) 50 MG tablet TAKE 1 TABLET BY MOUTH THREE TIMES DAILY 90 tablet 3  . irbesartan (AVAPRO) 300 MG tablet TAKE 1 TABLET(300 MG) BY MOUTH DAILY 90 tablet 1  . spironolactone (ALDACTONE) 25 MG tablet TAKE 1 TABLET BY MOUTH DAILY 90 tablet 1   No current facility-administered medications on file prior to visit.    PAST MEDICAL HISTORY: Past Medical History:  Diagnosis Date  . Hypercholesteremia   . Hypertension     PAST SURGICAL HISTORY: Past Surgical History:  Procedure Laterality Date  . PATENT FORAMEN OVALE(PFO) CLOSURE N/A 06/12/2017   Procedure: PATENT FORAMEN OVALE (PFO) CLOSURE;  Surgeon: Adrian Prows, MD;  Location: [facility];  Service: Cardiovascular;  Laterality: N/A;  . TEE WITHOUT CARDIOVERSION N/A 03/19/2017   Procedure: TRANSESOPHAGEAL ECHOCARDIOGRAM (TEE);  Surgeon: Nigel Mormon, MD;  Location: [facility] ENDOSCOPY;  Service: Cardiovascular;  Laterality: N/A;    FAMILY HISTORY: The patient's family history includes Kidney failure in his father; Stroke in his mother.   SOCIAL HISTORY:  The patient  reports that he has never smoked. He has never used smokeless tobacco. He reports that he does not drink alcohol and does not use drugs.  Review of Systems  Constitutional: Negative for chills and fever.  HENT:  Negative for hoarse voice and nosebleeds.   Eyes: Negative for discharge, double vision and pain.  Cardiovascular: Negative for chest pain, claudication, dyspnea on exertion, leg swelling, near-syncope, orthopnea, palpitations, paroxysmal nocturnal dyspnea and syncope.  Respiratory: Negative for hemoptysis and shortness of breath.     Musculoskeletal: Negative for muscle cramps and myalgias.  Gastrointestinal: Negative for abdominal pain, constipation, diarrhea, hematemesis, hematochezia, melena, nausea and vomiting.  Neurological: Negative for dizziness and light-headedness.   PHYSICAL EXAM: Vitals with BMI 09/15/2019 09/13/2018 06/03/2018  Height 5\' 11"  5\' 11"  6\' 0"   Weight 308 lbs 296 lbs 6 oz 277 lbs  BMI 42.98 41.36 37.56  Systolic 142 147  Diastolic 95 100 93  Pulse 81 62 64    CONSTITUTIONAL: Well-developed and well-nourished. No acute distress.  SKIN: Skin is warm and dry. No rash noted. No cyanosis. No pallor. No jaundice HEAD: Normocephalic and atraumatic.  EYES: No scleral icterus MOUTH/THROAT: Moist oral membranes.  NECK: No JVD present. No thyromegaly noted. No carotid bruits  LYMPHATIC: No visible cervical adenopathy.  CHEST Normal respiratory effort. No intercostal retractions  LUNGS: Clear to auscultation bilaterally.  No stridor. No wheezes. No rales.  CARDIOVASCULAR: Regular, positive S1-S2, no murmurs rubs or gallops appreciated ABDOMINAL: Obese, soft, nontender, nondistended, positive bowel sounds in all 4 quadrants, no apparent ascites.  EXTREMITIES: No peripheral edema  HEMATOLOGIC: No significant bruising NEUROLOGIC: Oriented to person, place, and time. Nonfocal. Normal muscle tone.  PSYCHIATRIC: Normal mood and affect. Normal behavior. Cooperative  RADIOLOGY: CTA ABD and pelvis 04/26/2016:IMPRESSION: VASCULAR 1. Mild right renal artery ostial plaque without convincing high-grade stenosis. 2. Minimal aortic atherosclerosis without aneurysm or stenosis. NON-VASCULAR 1. No acute findings  CARDIAC DATABASE: EKG: 09/15/2019: Normal sinus rhythm, 74 bpm, first-degree AV block, nonspecific T wave abnormality, without underlying injury pattern, no significant change compared to prior ECG.  Echocardiogram: 08/28/2017: Left ventricle cavity is normal in size. Severe concentric  hypertrophy of the left ventricle. Normal global wall motion. Doppler evidence of grade I (impaired) diastolic dysfunction, normal LAP. Calculated EF 67%. Left atrial cavity is severely dilated.  Interatrial septal occluder in place.  No significant valvular abnormality.  Inadequate tricuspid regurgitation jet to estimate pulmonary artery pressure. Normal right atrial pressure. Septal occluder new since last hospital echocardiogram in 03/2017.  Stress Testing:  Exercise myoview stress 04/06/2017:  1. The patient performed treadmill exercise using a Bruce protocol, completing 7:44 minutes. The patient completed an estimated workload of 9.73 METS, reaching 87% of the maximum predicted heart rate. Exercise capacity is normal for age. The patient did not develop symptoms other than fatigue during the procedure. Resting hypertension with normal blood pressure response at peak exercise. Peak BP 152/64 mmHg. Stress electrocardiogram is negative for ischemia.  2. The overall quality of the study is excellent. There is no evidence of abnormal lung activity. Stress and rest SPECT images demonstrate homogeneous tracer distribution throughout the myocardium. Gated SPECT imaging reveals normal myocardial thickening and wall motion. The left ventricular ejection fraction was normal (54%).  3. Low risk study.  Heart Catheterization: None  Renal artery duplex 04/21/2015: No evidence of renal artery occlusive disease in either renal artery. The ostium of the renal arteries not well visualized due to bodily habitus.  Normal intrarenal vascular perfusion is noted in both kidneys. Dilated abdominal aorta suggestive of abdominal aortic aneurysm measuring 3cmx3cm. Consider rechecking abdominal aortic duplex in a year.  LABORATORY DATA: CBC Latest Ref Rng & Units 03/19/2017 03/18/2017 03/16/2017  WBC 4.0 -  10.5 K/uL 7.0 6.1 6.9  Hemoglobin 13.0 - 17.0 g/dL 10.5(L) 10.0(L) 10.6(L)  Hematocrit 39 - 52 % 33.4(L) 30.7(L)  32.8(L)  Platelets 150 - 400 K/uL 390 329 315    CMP Latest Ref Rng & Units 03/04/2018 03/19/2017 03/19/2017  Glucose 65 - 99 mg/dL 133(H) 94 98  BUN 6 - 24 mg/dL _0 Creatinine 0.76 - 1.27 mg/dL 1.35(H) 1.68(H) 1.66(H)  Sodium 134 - 144 mmol/L 141 141 139  Potassium 3.5 - 5.2 mmol/L 4.3 4.0 3.7  Chloride 96 - 106 mmol/L 103 107 106  CO2 20 - 29 mmol/L _1 Calcium 8.7 - 10.2 mg/dL 9.3 9.2 8.9  Total Protein 6.5 - 8.1 g/dL - - -  Total Bilirubin 0.3 - 1.2 mg/dL - - -  Alkaline Phos 38 - 126 U/L - - -  AST 15 - 41 U/L - - -  ALT 17 - 63 U/L - - -    Lipid Panel     Component Value Date/Time   CHOL 129 03/19/2017 1659   TRIG 187 (H) 03/19/2017 1659   HDL 23 (L) 03/19/2017 1659   CHOLHDL 5.6 03/19/2017 1659   VLDL 37 03/19/2017 1659   LDLCALC 69 03/19/2017 1659    Lab Results  Component Value Date   HGBA1C 5.7 (H) 03/18/2017   No components found for: NTPROBNP Lab Results  Component Value Date   TSH 1.747 03/15/2017   TSH 1.291 03/12/2017   TSH 1.908 03/19/2015    Cardiac Panel (last 3 results) No results for input(s): CKTOTAL, CKMB, TROPONINIHS, RELINDX in the last 72 hours.   External Labs: Collected: 04/23/2019 Hemoglobin: 13.6 g/dL.  Creatinine 1.39 mg/dL. eGFR: 66 mL/min per 1.73 m Lipid profile: Total cholesterol 150, triglycerides 151, HDL 31, LDL 92, non-HDL 119 TSH: 2.1  IMPRESSION:    ICD-10-CM   1. Resistant hypertension  I10 EKG [id number]    Basic metabolic panel    Magnesium  2. History of non-ST elevation myocardial infarction (NSTEMI)  I25.2 LDL cholesterol, direct    Lipid Panel With LDL/HDL Ratio  3. History of cerebral embolic infarction  M08.67   4. S/P percutaneous patent foramen ovale closure  Z87.74   5. Class 3 severe obesity due to excess calories with serious comorbidity and body mass index (BMI) of 40.0 to 44.9 in adult (HCC)  E66.01    Z68.41   6. Mixed hyperlipidemia  E78.2 LDL cholesterol, direct    Lipid Panel With  LDL/HDL Ratio     RECOMMENDATIONS: Anthony Mcdaniel is a 49 y.o. male whose past medical history and cardiovascular risk factors include: Resistant hypertension, hyperlipidemia, history of stroke, status post PFO closure, obesity due to excess calories.  Resistant hypertension:  Antihypertensive medications reconciled.  Patient states that he does not take his hydralazine 3 times a day as prescribed.  At times he does forget to take his doses since he works the midnight shift.  Educated on importance of a low-salt diet.  Patient states that he is going approximately 40 pounds and he is the heaviest he has ever been.  Recommended increasing physical activity at least 30 minutes a day 5 days a week as tolerated.  Patient is asked to keep a log of his blood pressures and to call the office if systolic blood pressures are consistently greater than 140 mmHg after taking the medications as prescribed.  I have also reemphasized on repeating the sleep study as he may have underlying obstructive sleep  apnea given his underlying resistant hypertension.  Independently reviewed outside labs dated 04/23/2019.  Mixed hyperlipidemia:  Patient was supposed to have lipid panel done prior to today's office visit.  However, he forgot to do so.  We will check fasting lipid profile to reevaluate therapy.  History of stroke: Continue aspirin and statin therapy.  Educated on importance of secondary prevention.  Obesity, due to excess calories: . Body mass index is 42.96 kg/m. . I reviewed with the patient the importance of diet, regular physical activity/exercise, weight loss.   . Patient is educated on increasing physical activity gradually as tolerated.  With the goal of moderate intensity exercise for 30 minutes a day 5 days a week.   FINAL MEDICATION LIST END OF ENCOUNTER: No orders of the defined types were placed in this encounter.    Current Outpatient Medications:  .  amLODipine (NORVASC) 10  MG tablet, TAKE 1 TABLET(10 MG) BY MOUTH DAILY, Disp: 90 tablet, Rfl: 1 .  aspirin EC 81 MG tablet, Take 81 mg by mouth daily., Disp: , Rfl:  .  atorvastatin (LIPITOR) 80 MG tablet, Take 1 tablet (80 mg total) by mouth daily at 6 PM., Disp: 30 tablet, Rfl: 0 .  carvedilol (COREG) 12.5 MG tablet, Take 1 tablet (12.5 mg total) by mouth 2 (two) times daily., Disp: 180 tablet, Rfl: 3 .  clopidogrel (PLAVIX) 75 MG tablet, TAKE 1 TABLET(75 MG) BY MOUTH DAILY, Disp: 90 tablet, Rfl: 1 .  hydrALAZINE (APRESOLINE) 50 MG tablet, TAKE 1 TABLET BY MOUTH THREE TIMES DAILY, Disp: 90 tablet, Rfl: 3 .  irbesartan (AVAPRO) 300 MG tablet, TAKE 1 TABLET(300 MG) BY MOUTH DAILY, Disp: 90 tablet, Rfl: 1 .  spironolactone (ALDACTONE) 25 MG tablet, TAKE 1 TABLET BY MOUTH DAILY, Disp: 90 tablet, Rfl: 1  Orders Placed This Encounter  Procedures  . Basic metabolic panel  . Magnesium  . LDL cholesterol, direct  . Lipid Panel With LDL/HDL Ratio  . EKG 12-Lead    --Continue cardiac medications as reconciled in final medication list. --Return in about 6 months (around 03/15/2020) for BP follow up.. Or sooner if needed. --Continue follow-up with your primary care physician regarding the management of your other chronic comorbid conditions.  Patient's questions and concerns were addressed to his satisfaction. He voices understanding of the instructions provided during this encounter.   This note was created using a voice recognition software as a result there may be grammatical errors inadvertently enclosed that do not reflect the nature of this encounter. Every attempt is made to correct such errors.  Rex Kras, [place], [facility]  Pager: [phone] Office: [phone]

## 2019-09-24 ENCOUNTER — Other Ambulatory Visit: Payer: Self-pay

## 2019-09-24 MED ORDER — SPIRONOLACTONE 25 MG PO TABS: 25.0000 mg | ORAL_TABLET | Freq: Every day | ORAL | 1 refills | Status: AC

## 2019-10-06 ENCOUNTER — Other Ambulatory Visit: Payer: Self-pay | Admitting: Cardiology

## 2019-10-06 DIAGNOSIS — E785 Hyperlipidemia, unspecified: Secondary | ICD-10-CM

## 2019-11-18 ENCOUNTER — Other Ambulatory Visit: Payer: Self-pay | Admitting: Cardiology

## 2019-12-07 IMAGING — MR MR MRA HEAD W/O CM
1 series · 19 of 48 positions shown · non-contrast
Comparison: Brain MRI 03/15/2017.  Noncontrast head CT 03/12/2017.

CLINICAL DATA: 46-year-old male with altered mental status and
numerous punctate supratentorial acute infarcts on brain MRI
yesterday.

EXAM:
MRA HEAD WITHOUT CONTRAST
TECHNIQUE: Angiographic images of the Circle of Willis were obtained using MRA
technique without intravenous contrast.

[Series 3: ***fast (id) mt · axial · 1.4mm · 0.39mm/px · z∈[-53,+48]mm · 19 of 154 slices shown]
[im 1/154]
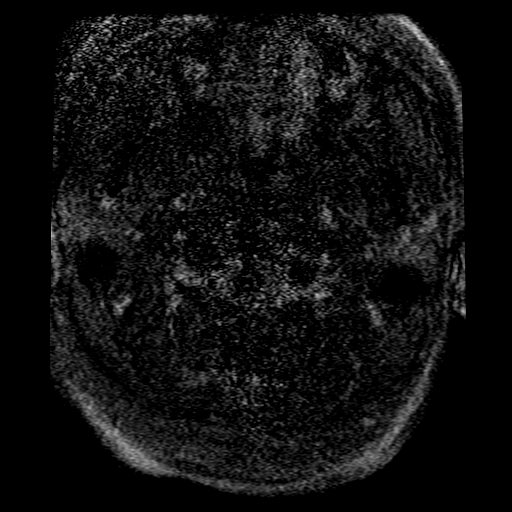
[im 4/154]
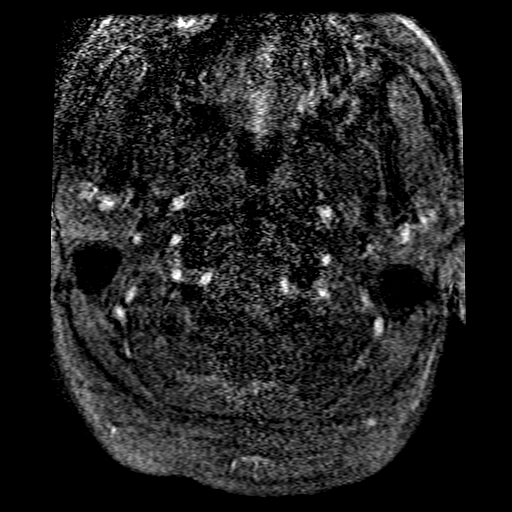
[im 7/154]
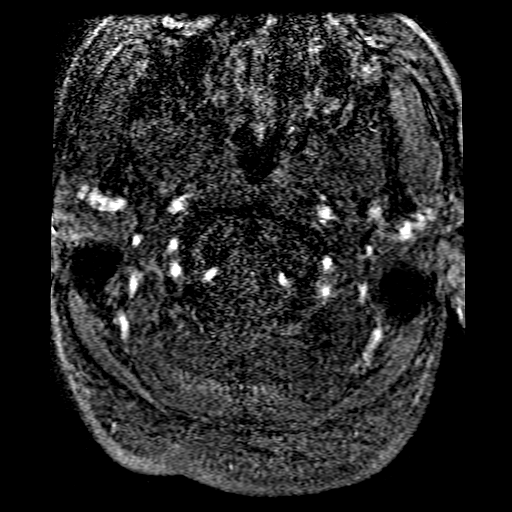
[im 10/154]
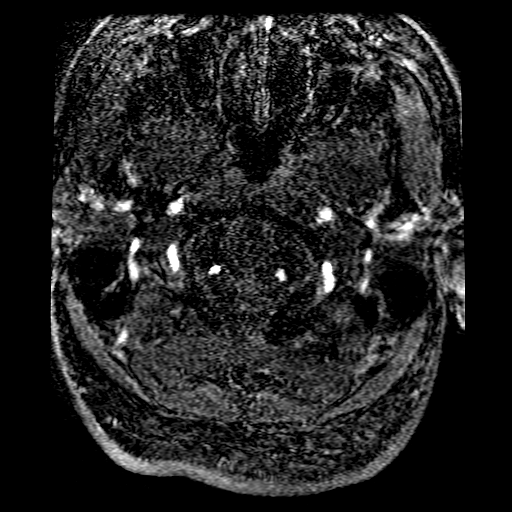
[im 14/154]
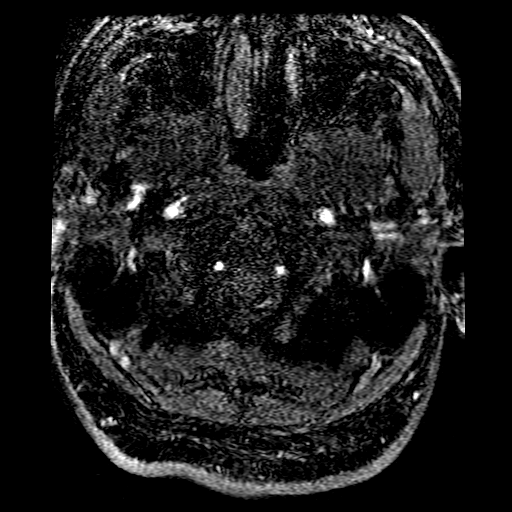
[im 17/154]
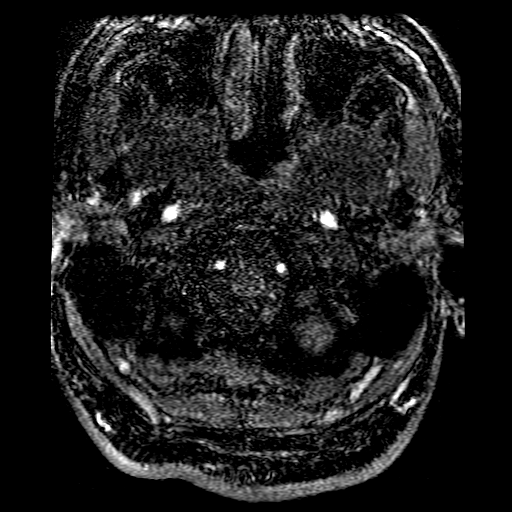
[im 20/154]
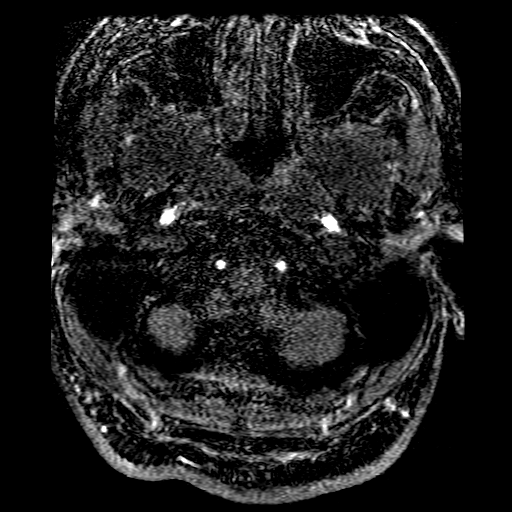
[im 23/154]
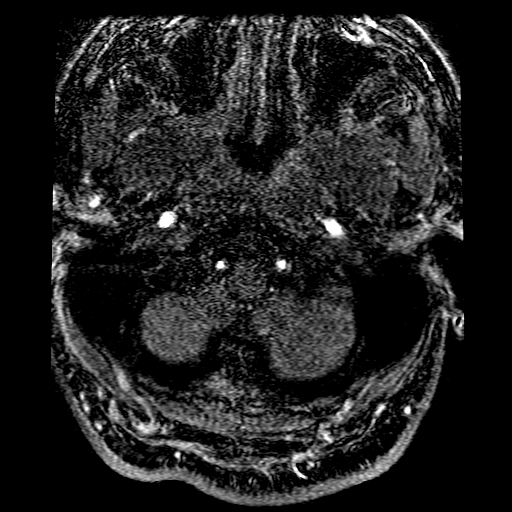
[im 27/154]
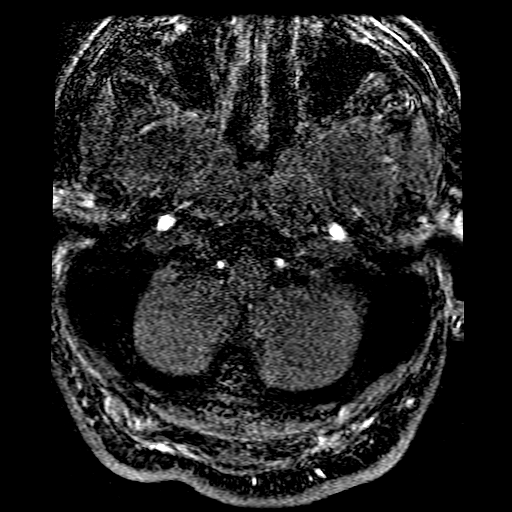
[im 30/154]
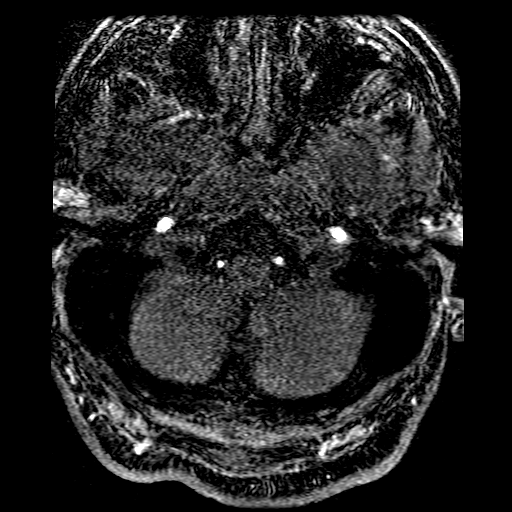
[im 33/154]
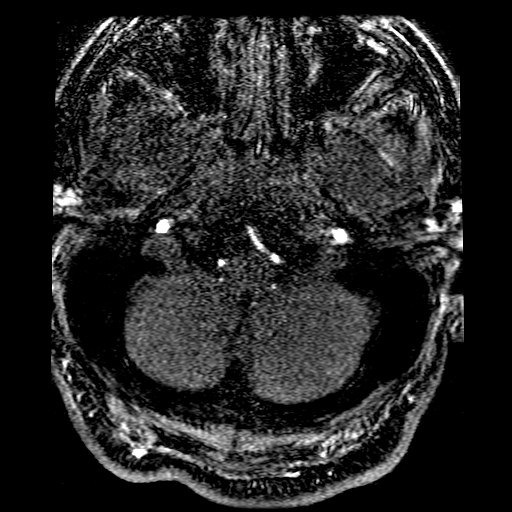
[im 49/154]
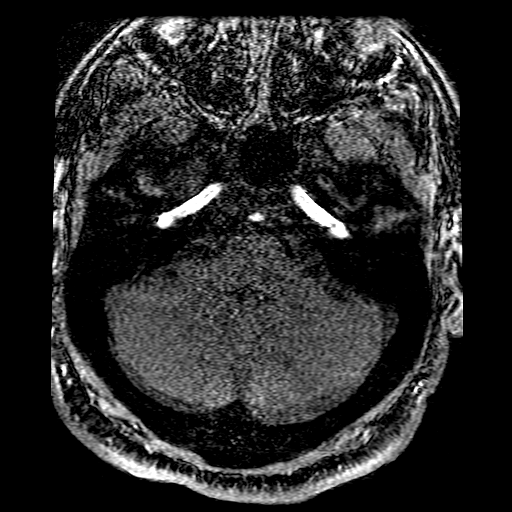
[im 69/154]
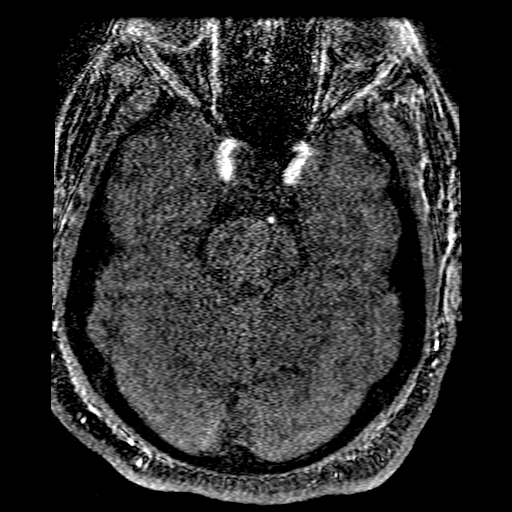
[im 79/154]
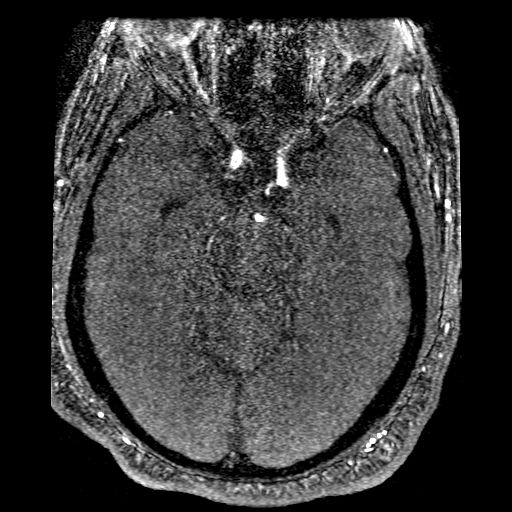
[im 88/154]
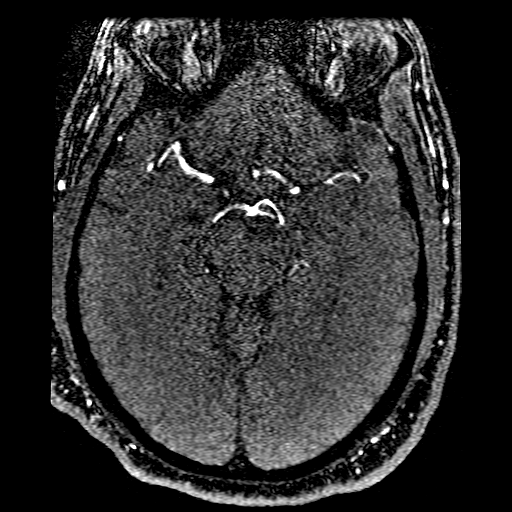
[im 108/154]
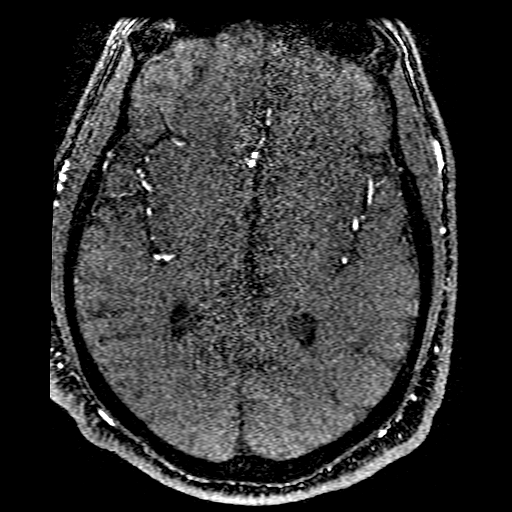
[im 127/154]
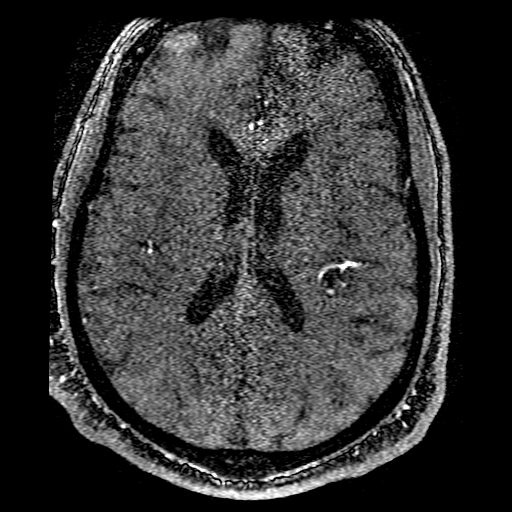
[im 131/154]
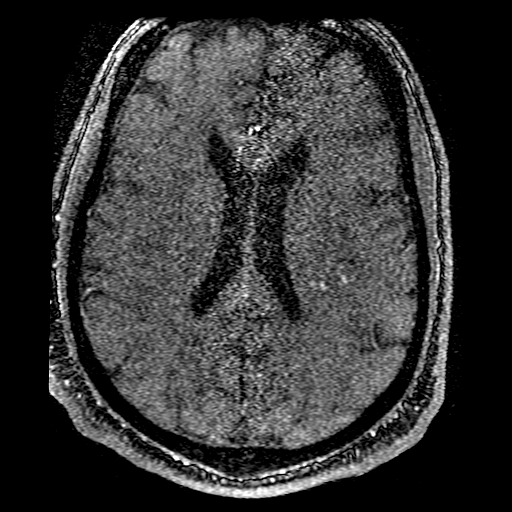
[im 147/154]
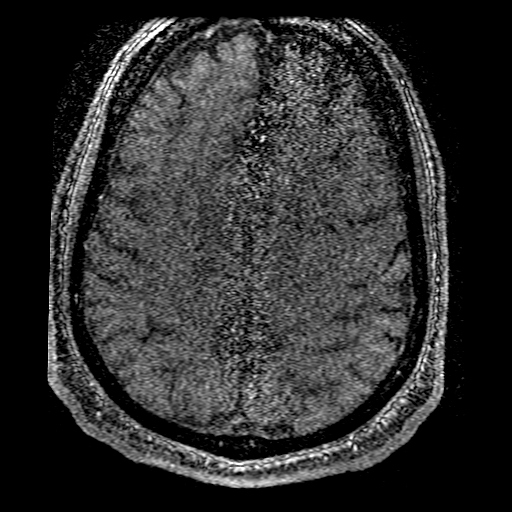

[19 of 48 positions shown; findings below may reference images not displayed]

FINDINGS: Study is mildly degraded by motion artifact. There is no
intracranial mass effect or ventriculomegaly.

Antegrade flow in the posterior circulation with codominant distal
vertebral arteries. No distal vertebral stenosis. Both PICA origins
are patent. Patent vertebrobasilar junction. Mildly tortuous and
irregular basilar artery but no convincing basilar stenosis. SCA and
PCA origins are patent and within normal limits, fetal type left PCA
origin. The right posterior communicating artery is present.
Bilateral PCA branches are within normal limits allowing for motion.

Antegrade flow in both ICA siphons. No siphon stenosis. Ophthalmic
and posterior communicating artery origins appear normal. The
carotid termini are patent.

The left ACA A1 segment is tortuous. The anterior communicating
artery and visible ACA branches are within normal limits. Bilateral
MCA origins and M1 segments appear normal. Both MCA bifurcations are
patent. The visible bilateral MCA branches are within normal limits
when allowing for some motion.
IMPRESSION: Negative intracranial MRA.

## 2019-12-22 ENCOUNTER — Other Ambulatory Visit: Payer: Self-pay | Admitting: Cardiology

## 2020-03-10 ENCOUNTER — Other Ambulatory Visit: Payer: Self-pay | Admitting: Cardiology

## 2020-03-13 ENCOUNTER — Other Ambulatory Visit: Payer: Self-pay | Admitting: Cardiology

## 2020-03-15 ENCOUNTER — Ambulatory Visit: Payer: 59 | Admitting: Cardiology

## 2020-04-08 ENCOUNTER — Ambulatory Visit: Payer: 59 | Admitting: Cardiology

## 2020-07-20 ENCOUNTER — Other Ambulatory Visit: Payer: Self-pay | Admitting: Cardiology

## 2020-08-18 ENCOUNTER — Other Ambulatory Visit: Payer: Self-pay | Admitting: Cardiology

## 2020-08-18 DIAGNOSIS — E785 Hyperlipidemia, unspecified: Secondary | ICD-10-CM

## 2020-10-13 ENCOUNTER — Other Ambulatory Visit: Payer: Self-pay | Admitting: Cardiology

## 2021-01-12 ENCOUNTER — Other Ambulatory Visit: Payer: Self-pay | Admitting: Cardiology

## 2021-06-02 ENCOUNTER — Ambulatory Visit: Payer: 59 | Admitting: Cardiology

## 2021-06-02 ENCOUNTER — Encounter: Payer: Self-pay | Admitting: Cardiology

## 2021-06-02 VITALS — BP 140/86 | HR 75 | Temp 97.9°F | Resp 16 | Ht 71.0 in | Wt 298.0 lb

## 2021-06-02 DIAGNOSIS — E782 Mixed hyperlipidemia: Secondary | ICD-10-CM

## 2021-06-02 DIAGNOSIS — I714 Abdominal aortic aneurysm, without rupture, unspecified: Secondary | ICD-10-CM

## 2021-06-02 DIAGNOSIS — Z8774 Personal history of (corrected) congenital malformations of heart and circulatory system: Secondary | ICD-10-CM

## 2021-06-02 DIAGNOSIS — I10 Essential (primary) hypertension: Secondary | ICD-10-CM

## 2021-06-02 DIAGNOSIS — Z8673 Personal history of transient ischemic attack (TIA), and cerebral infarction without residual deficits: Secondary | ICD-10-CM

## 2021-06-02 NOTE — Progress Notes (Signed)
Anthony Mcdaniel Date of Birth: 02-Jul-1970 MRN: [id number] Primary Care Provider:Wolters, Ivin Booty, MD Former Cardiology Providers: Jeri Lager, APRN, FNP-C  Primary Cardiologist: Rex Kras, DO, [facility] (established care 09/15/2019)  Date: 06/02/21 Last Office Visit: 09/15/2019  Chief Complaint  Patient presents with   Hypertension   Follow-up    HPI  Anthony Mcdaniel is a 51 y.o.  male whose past medical history and cardiovascular risk factors include: Resistant hypertension, history of NSTEMI, hyperlipidemia, history of stroke, status post PFO closure, obesity due to excess calories.  Patient is being followed for management of hypertension.  Patient was supposed to be here on annual basis but now presents 2 years later.  Patient does not check his blood pressures at home as the machine is malfunctioning but plans to invest in a new blood pressure cuff.  He has lost 10 pounds since last office visit due to dietary changes.  His medications are currently being filled by PCP.  At last office visit he was recommended to have a sleep study done which is still pending.  Patient also has a history of stroke and based on the MRI results from February 2019 he had multiple small supratentorial acute infarcts and was subsequently noted to have a PFO which was successfully repaired in May 2019.  Patient is accompanied by his wife who also provides collateral history at today's visit.  Outside labs from May 2023 provided by the patient independently reviewed and noted below for further reference.  After his recent labs his Crestor was increased to 40 mg p.o. nightly by his PCP and he has follow-up labs in July 2023.   FUNCTIONAL STATUS: Patient tries to walk at least [phone] steps per day at work, no structured exercise program or daily routine.   ALLERGIES: No Known Allergies   MEDICATION LIST PRIOR TO VISIT: Current Outpatient Medications on File Prior to Visit  Medication Sig Dispense  Refill   amLODipine (NORVASC) 10 MG tablet TAKE 1 TABLET(10 MG) BY MOUTH DAILY 90 tablet 1   aspirin EC 81 MG tablet Take 81 mg by mouth daily.     carvedilol (COREG) 25 MG tablet Take 25 mg by mouth 2 (two) times daily.     clopidogrel (PLAVIX) 75 MG tablet TAKE 1 TABLET(75 MG) BY MOUTH DAILY 90 tablet 1   hydrALAZINE (APRESOLINE) 50 MG tablet TAKE 1 TABLET BY MOUTH THREE TIMES DAILY (Patient taking differently: Take 50 mg by mouth 3 (three) times daily.) 90 tablet 3   rosuvastatin (CRESTOR) 40 MG tablet Take 40 mg by mouth daily.     spironolactone (ALDACTONE) 25 MG tablet Take 1 tablet (25 mg total) by mouth daily. (Patient taking differently: Take 25 mg by mouth 2 (two) times daily.) 90 tablet 1   No current facility-administered medications on file prior to visit.    PAST MEDICAL HISTORY: Past Medical History:  Diagnosis Date   Hypercholesteremia    Hypertension    NSTEMI (non-ST elevated myocardial infarction) ([place])    PFO (patent foramen ovale)    Stroke ([place])     PAST SURGICAL HISTORY: Past Surgical History:  Procedure Laterality Date   PATENT FORAMEN OVALE(PFO) CLOSURE N/A 06/12/2017   Procedure: PATENT FORAMEN OVALE (PFO) CLOSURE;  Surgeon: Adrian Prows, MD;  Location: [place] [facility];  Service: Cardiovascular;  Laterality: N/A;   TEE WITHOUT CARDIOVERSION N/A 03/19/2017   Procedure: TRANSESOPHAGEAL ECHOCARDIOGRAM (TEE);  Surgeon: Nigel Mormon, MD;  Location: [place];  Service: Cardiovascular;  Laterality: N/A;  FAMILY HISTORY: The patient's family history includes Kidney failure in his father; Stroke in his mother.   SOCIAL HISTORY:  The patient  reports that he has never smoked. He has never used smokeless tobacco. He reports that he does not drink alcohol and does not use drugs.  Review of Systems  Constitutional: Negative for chills and fever.  HENT:  Negative for hoarse voice and nosebleeds.   Eyes:  Negative for discharge, double vision and pain.   Cardiovascular:  Negative for chest pain, claudication, dyspnea on exertion, leg swelling, near-syncope, orthopnea, palpitations, paroxysmal nocturnal dyspnea and syncope.  Respiratory:  Negative for hemoptysis and shortness of breath.   Musculoskeletal:  Negative for muscle cramps and myalgias.  Gastrointestinal:  Negative for abdominal pain, constipation, diarrhea, hematemesis, hematochezia, melena, nausea and vomiting.  Neurological:  Negative for dizziness and light-headedness.  PHYSICAL EXAM:    06/02/2021    1:58 PM 06/02/2021    1:57 PM 09/15/2019    2:15 PM  Vitals with BMI  Height  '5\' 11"'  '5\' 11"'   Weight  298 lbs 308 lbs  BMI  03.47 42.59  Systolic [phone]  Diastolic 86 92 95  Pulse 75 75 81    CONSTITUTIONAL: Well-developed and well-nourished. No acute distress.  SKIN: Skin is warm and dry. No rash noted. No cyanosis. No pallor. No jaundice HEAD: Normocephalic and atraumatic.  EYES: No scleral icterus MOUTH/THROAT: Moist oral membranes.  NECK: No JVD present. No thyromegaly noted. No carotid bruits  CHEST Normal respiratory effort. No intercostal retractions  LUNGS: Clear to auscultation bilaterally.  No stridor. No wheezes. No rales.  CARDIOVASCULAR: Regular, positive S1-S2, no murmurs rubs or gallops appreciated ABDOMINAL: Obese, soft, nontender, nondistended, positive bowel sounds in all 4 quadrants, no apparent ascites.  No abdominal bruits. EXTREMITIES: No peripheral edema, warm to touch, 2+ DP and PT pulses. HEMATOLOGIC: No significant bruising NEUROLOGIC: Oriented to person, place, and time. Nonfocal. Normal muscle tone.  PSYCHIATRIC: Normal mood and affect. Normal behavior. Cooperative  RADIOLOGY: CTA ABD and pelvis 04/26/2016: IMPRESSION: VASCULAR  1. Mild right renal artery ostial plaque without convincing high-grade stenosis. 2. Minimal aortic atherosclerosis without aneurysm or stenosis.  NON-VASCULAR  1. No acute findings  CARDIAC  DATABASE: EKG: 06/02/2021: NSR, 75 bpm, normal axis, first-degree AV block, nonspecific T wave abnormality.   Echocardiogram: 08/28/2017: Left ventricle cavity is normal in size. Severe concentric hypertrophy of the left ventricle. Normal global wall motion. Doppler evidence of grade I (impaired) diastolic dysfunction, normal LAP. Calculated EF 67%. Left atrial cavity is severely dilated.   Interatrial septal occluder in place.  No significant valvular abnormality.  Inadequate tricuspid regurgitation jet to estimate pulmonary artery pressure. Normal right atrial pressure. Septal occluder new since last hospital echocardiogram in 03/2017.  Stress Testing:  Exercise myoview stress 04/06/2017:  1.  The patient performed treadmill exercise using a Bruce protocol, completing 7:44 minutes. The patient completed an estimated workload of 9.73 METS, reaching 87% of the maximum predicted heart rate. Exercise capacity is normal for age. The patient did not develop symptoms other than fatigue during the procedure. Resting hypertension with normal blood pressure response at peak exercise. Peak BP 152/64 mmHg. Stress electrocardiogram is negative for ischemia.  2. The overall quality of the study is excellent. There is no evidence of abnormal lung activity. Stress and rest SPECT images demonstrate homogeneous tracer distribution throughout the myocardium. Gated SPECT imaging reveals normal myocardial thickening and wall motion. The left ventricular ejection fraction was normal (54%).  3. Low risk study.  Heart Catheterization: None  Renal artery duplex 04/21/2015: No evidence of renal artery occlusive disease in either renal artery. The ostium of the renal arteries not well visualized due to bodily habitus.  Normal intrarenal vascular perfusion is noted in both kidneys. Dilated abdominal aorta suggestive of abdominal aortic aneurysm measuring 3cmx3cm. Consider rechecking abdominal aortic duplex in a  year.  LABORATORY DATA:    Latest Ref Rng & Units 03/19/2017    8:46 AM 03/18/2017    3:57 AM 03/16/2017    4:53 AM  CBC  WBC 4.0 - 10.5 K/uL 7.0   6.1   6.9    Hemoglobin 13.0 - 17.0 g/dL 10.5   10.0   10.6    Hematocrit 39.0 - 52.0 % 33.4   30.7   32.8    Platelets 150 - 400 K/uL 390   329   315         Latest Ref Rng & Units 03/04/2018   10:49 AM 03/19/2017    8:46 AM 03/19/2017   12:07 AM  CMP  Glucose 65 - 99 mg/dL 133   94   98    BUN 6 - 24 mg/dL '22   19   20    ' Creatinine 0.76 - 1.27 mg/dL 1.35   1.68   1.66    Sodium 134 - 144 mmol/L 141   141   139    Potassium 3.5 - 5.2 mmol/L 4.3   4.0   3.7    Chloride 96 - 106 mmol/L 103   107   106    CO2 20 - 29 mmol/L '22   23   22    ' Calcium 8.7 - 10.2 mg/dL 9.3   9.2   8.9      Lipid Panel     Component Value Date/Time   CHOL 129 03/19/2017 1659   TRIG 187 (H) 03/19/2017 1659   HDL 23 (L) 03/19/2017 1659   CHOLHDL 5.6 03/19/2017 1659   VLDL 37 03/19/2017 1659   LDLCALC 69 03/19/2017 1659    Lab Results  Component Value Date   HGBA1C 5.7 (H) 03/18/2017   No components found for: NTPROBNP Lab Results  Component Value Date   TSH 1.747 03/15/2017   TSH 1.291 03/12/2017   TSH 1.908 03/19/2015    Cardiac Panel (last 3 results) No results for input(s): CKTOTAL, CKMB, TROPONINIHS, RELINDX in the last 72 hours.   External Labs: Collected: 04/23/2019 Hemoglobin: 13.6 g/dL.  Creatinine 1.39 mg/dL. eGFR: 66 mL/min per 1.73 m Lipid profile: Total cholesterol 150, triglycerides 151, HDL 31, LDL 92, non-HDL 119 TSH: 2.1  External Labs: Collected: May 18, 2021 provided by patient Total cholesterol 180, triglycerides 165, HDL 36, LDL calculated 115, non-HDL 144 BUN 17, creatinine 1.18. Sodium 140, potassium 4.6, chloride 106, bicarb 26 AST 22, ALT 28, alkaline phosphatase 99 Hemoglobin 14.6 g/dL, hematocrit 45.1% Hemoglobin A1c 6 TSH 2.37   IMPRESSION:    ICD-10-CM   1. Resistant hypertension  I10 EKG 12-Lead    2.  Abdominal aortic aneurysm (AAA) without rupture, unspecified part ([place])  I71.40 PCV AORTA DUPLEX    3. History of cerebral embolic infarction  K35.46     4. S/P percutaneous patent foramen ovale closure  Z87.74 PCV ECHOCARDIOGRAM COMPLETE W BUBBLE    5. Mixed hyperlipidemia  E78.2     6. Class 3 severe obesity due to excess calories with serious comorbidity and body mass index (BMI) of 40.0  to 44.9 in adult [facility])  E66.01    Z68.41        RECOMMENDATIONS: Kemuel Buchmann is a 51 y.o. male whose past medical history and cardiovascular risk factors include: Resistant hypertension, Prediabetic, hyperlipidemia, history of stroke, status post PFO closure, obesity due to excess calories.  Resistant hypertension Medications reconciled. Office blood pressures within acceptable range but not at goal. Patient is planning to invest in a blood pressure cuff and will uptitrate medical therapy based on results. Reemphasized importance of low-salt diet.  Abdominal aortic aneurysm (AAA) without rupture, unspecified part ([place]) History of renal duplex in April 2017 which noted abdominal aortic aneurysm measuring 3 x 3 cm. We will repeat duplex to evaluate disease progression. Patient is made aware of symptoms to look out for with regards to aortic syndromes.  Both patient and wife verbalized understanding and the importance of seeking medical attention by going to the closest ER via EMS if they surface.  History of cerebral embolic infarction & S/P percutaneous patent foramen ovale closure Continue aspirin and statin therapy. Statin dose recently increased by his PCP with follow-up labs pending. We will check an echocardiogram with bubble study.  Mixed hyperlipidemia Currently managed by primary care provider.  Class 3 severe obesity due to excess calories with serious comorbidity and body mass index (BMI) of 40.0 to 44.9 in adult [facility]) Body mass index is 41.56 kg/m. Patient has lost weight over  the last 2 years for which he is congratulated for today's visit. I reviewed with the patient the importance of diet, regular physical activity/exercise, weight loss.   Patient is educated on increasing physical activity gradually as tolerated.  With the goal of moderate intensity exercise for 30 minutes a day 5 days a week.  FINAL MEDICATION LIST END OF ENCOUNTER: No orders of the defined types were placed in this encounter.    Current Outpatient Medications:    amLODipine (NORVASC) 10 MG tablet, TAKE 1 TABLET(10 MG) BY MOUTH DAILY, Disp: 90 tablet, Rfl: 1   aspirin EC 81 MG tablet, Take 81 mg by mouth daily., Disp: , Rfl:    carvedilol (COREG) 25 MG tablet, Take 25 mg by mouth 2 (two) times daily., Disp: , Rfl:    clopidogrel (PLAVIX) 75 MG tablet, TAKE 1 TABLET(75 MG) BY MOUTH DAILY, Disp: 90 tablet, Rfl: 1   hydrALAZINE (APRESOLINE) 50 MG tablet, TAKE 1 TABLET BY MOUTH THREE TIMES DAILY (Patient taking differently: Take 50 mg by mouth 3 (three) times daily.), Disp: 90 tablet, Rfl: 3   rosuvastatin (CRESTOR) 40 MG tablet, Take 40 mg by mouth daily., Disp: , Rfl:    spironolactone (ALDACTONE) 25 MG tablet, Take 1 tablet (25 mg total) by mouth daily. (Patient taking differently: Take 25 mg by mouth 2 (two) times daily.), Disp: 90 tablet, Rfl: 1  Orders Placed This Encounter  Procedures   EKG 12-Lead   PCV ECHOCARDIOGRAM COMPLETE W BUBBLE   PCV AORTA DUPLEX    --Continue cardiac medications as reconciled in final medication list. --Return in about 8 weeks (around 07/28/2021) for Follow up, BP, Review test results. Or sooner if needed. --Continue follow-up with your primary care physician regarding the management of your other chronic comorbid conditions.  Patient's questions and concerns were addressed to his satisfaction. He voices understanding of the instructions provided during this encounter.   This note was created using a voice recognition software as a result there may be  grammatical errors inadvertently enclosed that do not reflect the nature  of this encounter. Every attempt is made to correct such errors.  Rex Kras, [place], [facility]  Pager: [phone] Office: [phone]

## 2021-06-29 ENCOUNTER — Ambulatory Visit: Payer: 59

## 2021-06-29 DIAGNOSIS — I714 Abdominal aortic aneurysm, without rupture, unspecified: Secondary | ICD-10-CM

## 2021-06-29 DIAGNOSIS — Z8774 Personal history of (corrected) congenital malformations of heart and circulatory system: Secondary | ICD-10-CM

## 2021-07-05 ENCOUNTER — Other Ambulatory Visit: Payer: Self-pay | Admitting: Cardiology

## 2021-07-28 ENCOUNTER — Ambulatory Visit: Payer: 59 | Admitting: Cardiology

## 2021-07-28 ENCOUNTER — Encounter: Payer: Self-pay | Admitting: Cardiology

## 2021-07-28 VITALS — BP 132/83 | HR 65 | Temp 97.9°F | Resp 16 | Ht 71.0 in | Wt 289.4 lb

## 2021-07-28 DIAGNOSIS — I10 Essential (primary) hypertension: Secondary | ICD-10-CM

## 2021-07-28 DIAGNOSIS — Z8774 Personal history of (corrected) congenital malformations of heart and circulatory system: Secondary | ICD-10-CM

## 2021-07-28 DIAGNOSIS — I714 Abdominal aortic aneurysm, without rupture, unspecified: Secondary | ICD-10-CM

## 2021-07-28 DIAGNOSIS — E782 Mixed hyperlipidemia: Secondary | ICD-10-CM

## 2021-07-28 DIAGNOSIS — Z8673 Personal history of transient ischemic attack (TIA), and cerebral infarction without residual deficits: Secondary | ICD-10-CM

## 2021-07-28 NOTE — Progress Notes (Signed)
Anthony Mcdaniel Date of Birth: [birth year] MRN: [id number] Primary Care Provider:Wolters, Ivin Booty, MD Former Cardiology Providers: Jeri Lager, APRN, FNP-C  Primary Cardiologist: Rex Kras, DO, [facility] (established care 09/15/2019)  Date: 07/28/21 Last Office Visit: 06/02/2021  Chief Complaint  Patient presents with   Hypertension   Results   Follow-up    8 weeks    HPI  Brandi Armato is a 51 y.o.  male whose past medical history and cardiovascular risk factors include: Resistant hypertension, history of NSTEMI, AAA, hyperlipidemia, history of stroke, status post PFO closure, obesity due to excess calories.  Patient follows up with the practice for management of resistant hypertension and history of PFO closure.  At last office visit this episode was to proceed with an echocardiogram to reevaluate for structural heart disease and patency of PFO closure he also underwent ultrasound of the abdomen to evaluate for progression of AAA.  Patient states that his home blood pressures are better controlled with SBP ranging between 120-130 mmHg.  FUNCTIONAL STATUS: Patient tries to walk at least [phone] steps per day at work, no structured exercise program or daily routine.   ALLERGIES: No Known Allergies   MEDICATION LIST PRIOR TO VISIT: Current Outpatient Medications on File Prior to Visit  Medication Sig Dispense Refill   amLODipine (NORVASC) 10 MG tablet TAKE 1 TABLET(10 MG) BY MOUTH DAILY 90 tablet 1   aspirin EC 81 MG tablet Take 81 mg by mouth daily.     carvedilol (COREG) 25 MG tablet Take 25 mg by mouth 2 (two) times daily.     clopidogrel (PLAVIX) 75 MG tablet TAKE 1 TABLET(75 MG) BY MOUTH DAILY 90 tablet 1   hydrALAZINE (APRESOLINE) 50 MG tablet TAKE 1 TABLET BY MOUTH THREE TIMES DAILY (Patient taking differently: Take 50 mg by mouth 3 (three) times daily.) 90 tablet 3   rosuvastatin (CRESTOR) 40 MG tablet Take 40 mg by mouth daily.     spironolactone (ALDACTONE) 25 MG  tablet Take 1 tablet (25 mg total) by mouth daily. (Patient taking differently: Take 25 mg by mouth 2 (two) times daily.) 90 tablet 1   No current facility-administered medications on file prior to visit.    PAST MEDICAL HISTORY: Past Medical History:  Diagnosis Date   Hypercholesteremia    Hypertension    NSTEMI (non-ST elevated myocardial infarction) ([place])    PFO (patent foramen ovale)    Stroke ([place])     PAST SURGICAL HISTORY: Past Surgical History:  Procedure Laterality Date   PATENT FORAMEN OVALE(PFO) CLOSURE N/A 06/12/2017   Procedure: PATENT FORAMEN OVALE (PFO) CLOSURE;  Surgeon: Adrian Prows, MD;  Location: [facility];  Service: Cardiovascular;  Laterality: N/A;   TEE WITHOUT CARDIOVERSION N/A 03/19/2017   Procedure: TRANSESOPHAGEAL ECHOCARDIOGRAM (TEE);  Surgeon: Nigel Mormon, MD;  Location: [facility] ENDOSCOPY;  Service: Cardiovascular;  Laterality: N/A;    FAMILY HISTORY: The patient's family history includes Kidney failure in his father; Stroke in his mother.   SOCIAL HISTORY:  The patient  reports that he has never smoked. He has never used smokeless tobacco. He reports that he does not drink alcohol and does not use drugs.  Review of Systems  Constitutional: Negative for chills and fever.  HENT:  Negative for hoarse voice and nosebleeds.   Eyes:  Negative for discharge, double vision and pain.  Cardiovascular:  Negative for chest pain, claudication, dyspnea on exertion, leg swelling, near-syncope, orthopnea, palpitations, paroxysmal nocturnal dyspnea and syncope.  Respiratory:  Negative for hemoptysis  and shortness of breath.   Musculoskeletal:  Negative for muscle cramps and myalgias.  Gastrointestinal:  Negative for abdominal pain, constipation, diarrhea, hematemesis, hematochezia, melena, nausea and vomiting.  Neurological:  Negative for dizziness and light-headedness.   PHYSICAL EXAM:    07/28/2021   11:23 AM 06/02/2021    1:58 PM 06/02/2021    1:57 PM   Vitals with BMI  Height 5' 11"  5' 11"  Weight 289 lbs 6 oz  298 lbs  BMI 77.82  42.35  Systolic 361 443 154  Diastolic 83 86 92  Pulse 65 75 75    CONSTITUTIONAL: Well-developed and well-nourished. No acute distress.  SKIN: Skin is warm and dry. No rash noted. No cyanosis. No pallor. No jaundice HEAD: Normocephalic and atraumatic.  EYES: No scleral icterus MOUTH/THROAT: Moist oral membranes.  NECK: No JVD present. No thyromegaly noted. No carotid bruits  CHEST Normal respiratory effort. No intercostal retractions  LUNGS: Clear to auscultation bilaterally.  No stridor. No wheezes. No rales.  CARDIOVASCULAR: Regular, positive S1-S2, no murmurs rubs or gallops appreciated ABDOMINAL: Obese, soft, nontender, nondistended, positive bowel sounds in all 4 quadrants, no apparent ascites.  No abdominal bruits. EXTREMITIES: No peripheral edema, warm to touch, 2+ DP and PT pulses. HEMATOLOGIC: No significant bruising NEUROLOGIC: Oriented to person, place, and time. Nonfocal. Normal muscle tone.  PSYCHIATRIC: Normal mood and affect. Normal behavior. Cooperative  RADIOLOGY: CTA ABD and pelvis 04/26/2016: IMPRESSION: VASCULAR  1. Mild right renal artery ostial plaque without convincing high-grade stenosis. 2. Minimal aortic atherosclerosis without aneurysm or stenosis.  NON-VASCULAR  1. No acute findings  CARDIAC DATABASE: EKG: 06/02/2021: NSR, 75 bpm, normal axis, first-degree AV block, nonspecific T wave abnormality.   Echocardiogram: 06/29/2021: Normal LV systolic function with visual EF 55-60%. Left ventricle cavity is normal in size. Moderate to severe left ventricular hypertrophy. Normal global wall motion. Normal diastolic filling pattern, normal LAP.  Left atrial cavity is mildly dilated. Interatrial septal occluder in place. Negative bubble study.  IVC is dilated with a respiratory response of >50%. Compared to 08/28/2017 severe LAE is now mild otherwise no significant change.    Stress Testing:  Exercise myoview stress 04/06/2017:  1.  The patient performed treadmill exercise using a Bruce protocol, completing 7:44 minutes. The patient completed an estimated workload of 9.73 METS, reaching 87% of the maximum predicted heart rate. Exercise capacity is normal for age. The patient did not develop symptoms other than fatigue during the procedure. Resting hypertension with normal blood pressure response at peak exercise. Peak BP 152/64 mmHg. Stress electrocardiogram is negative for ischemia.  2. The overall quality of the study is excellent. There is no evidence of abnormal lung activity. Stress and rest SPECT images demonstrate homogeneous tracer distribution throughout the myocardium. Gated SPECT imaging reveals normal myocardial thickening and wall motion. The left ventricular ejection fraction was normal (54%).   3. Low risk study.  Heart Catheterization: None  Abdominal Aortic Duplex 06/29/2021: Moderate dilatation of the abdominal aorta is noted in the mid aorta.  An abdominal aortic aneurysm measuring 3.1 x 3.2 x 3.2 cm is seen. Mild diffuse abdominal aortic atherosclerosis especially in the mid abdominal aorta.  Recheck in 3 years for stability.   LABORATORY DATA:    Latest Ref Rng & Units 03/19/2017    8:46 AM 03/18/2017    3:57 AM 03/16/2017    4:53 AM  CBC  WBC 4.0 - 10.5 K/uL 7.0  6.1  6.9   Hemoglobin 13.0 - 17.0  g/dL 10.5  10.0  10.6   Hematocrit 39.0 - 52.0 % 33.4  30.7  32.8   Platelets 150 - 400 K/uL 390  329  315        Latest Ref Rng & Units 03/04/2018   10:49 AM 03/19/2017    8:46 AM 03/19/2017   12:07 AM  CMP  Glucose 65 - 99 mg/dL 133  94  98   BUN 6 - 24 mg/dL _0 Creatinine 0.76 - 1.27 mg/dL 1.35  1.68  1.66   Sodium 134 - 144 mmol/L 141  141  139   Potassium 3.5 - 5.2 mmol/L 4.3  4.0  3.7   Chloride 96 - 106 mmol/L 103  107  106   CO2 20 - 29 mmol/L _1 Calcium 8.7 - 10.2 mg/dL 9.3  9.2  8.9     Lipid Panel      Component Value Date/Time   CHOL 129 03/19/2017 1659   TRIG 187 (H) 03/19/2017 1659   HDL 23 (L) 03/19/2017 1659   CHOLHDL 5.6 03/19/2017 1659   VLDL 37 03/19/2017 1659   LDLCALC 69 03/19/2017 1659    Lab Results  Component Value Date   HGBA1C 5.7 (H) 03/18/2017   No components found for: "NTPROBNP" Lab Results  Component Value Date   TSH 1.747 03/15/2017   TSH 1.291 03/12/2017   TSH 1.908 03/19/2015    Cardiac Panel (last 3 results) No results for input(s): "CKTOTAL", "CKMB", "TROPONINIHS", "RELINDX" in the last 72 hours.   External Labs: Collected: 04/23/2019 Hemoglobin: 13.6 g/dL.  Creatinine 1.39 mg/dL. eGFR: 66 mL/min per 1.73 m Lipid profile: Total cholesterol 150, triglycerides 151, HDL 31, LDL 92, non-HDL 119 TSH: 2.1  External Labs: Collected: May 18, 2021 provided by patient Total cholesterol 180, triglycerides 165, HDL 36, LDL calculated 115, non-HDL 144 BUN 17, creatinine 1.18. Sodium 140, potassium 4.6, chloride 106, bicarb 26 AST 22, ALT 28, alkaline phosphatase 99 Hemoglobin 14.6 g/dL, hematocrit 45.1% Hemoglobin A1c 6 TSH 2.37   IMPRESSION:    ICD-10-CM   1. Resistant hypertension  I10     2. Abdominal aortic aneurysm (AAA) without rupture, unspecified part ([place])  I71.40 PCV AORTA DUPLEX    3. History of cerebral embolic infarction  P59.16     4. S/P percutaneous patent foramen ovale closure  Z87.74     5. Mixed hyperlipidemia  E78.2     6. Class 3 severe obesity due to excess calories with serious comorbidity and body mass index (BMI) of 40.0 to 44.9 in adult [facility])  E66.01    Z68.41        RECOMMENDATIONS: Sidharth Leverette is a 51 y.o. male whose past medical history and cardiovascular risk factors include: Resistant hypertension, Prediabetic, hyperlipidemia, history of stroke, status post PFO closure, obesity due to excess calories.  Resistant hypertension Office blood pressures are well controlled. Home blood pressures also within  acceptable limits. Continue current medical therapy, no changes warranted.  Abdominal aortic aneurysm (AAA) without rupture, unspecified part ([place]) Patient recently had a 3-year follow-up abdominal aortic duplex which notes the aneurysm to be relatively stable at 3.1 x 3.2 x 3.2 cm.  Reeducated on the importance of blood pressure management. Recommend rechecking in 3 years  June 2026 - cannot order 3-year follow-up study due to limitation in EMR but patient is aware of the recommendation.  History of cerebral embolic infarction /S/P percutaneous patent foramen ovale  closure Continue aspirin and statin therapy. Educated on importance of secondary prevention. Recent echocardiogram notes patency of the PFO closure.  Mixed hyperlipidemia Recently had uptitrated the dose of Crestor at the request of PCP. Tolerating the higher dose of Crestor well without any side effects or intolerances. Currently managed by primary care provider.  Class 3 severe obesity due to excess calories with serious comorbidity and body mass index (BMI) of 40.0 to 44.9 in adult [facility]) Body mass index is 40.36 kg/m. I reviewed with the patient the importance of diet, regular physical activity/exercise, weight loss.   Patient is educated on increasing physical activity gradually as tolerated.  With the goal of moderate intensity exercise for 30 minutes a day 5 days a week.  FINAL MEDICATION LIST END OF ENCOUNTER: No orders of the defined types were placed in this encounter.    Current Outpatient Medications:    amLODipine (NORVASC) 10 MG tablet, TAKE 1 TABLET(10 MG) BY MOUTH DAILY, Disp: 90 tablet, Rfl: 1   aspirin EC 81 MG tablet, Take 81 mg by mouth daily., Disp: , Rfl:    carvedilol (COREG) 25 MG tablet, Take 25 mg by mouth 2 (two) times daily., Disp: , Rfl:    clopidogrel (PLAVIX) 75 MG tablet, TAKE 1 TABLET(75 MG) BY MOUTH DAILY, Disp: 90 tablet, Rfl: 1   hydrALAZINE (APRESOLINE) 50 MG tablet, TAKE 1 TABLET BY  MOUTH THREE TIMES DAILY (Patient taking differently: Take 50 mg by mouth 3 (three) times daily.), Disp: 90 tablet, Rfl: 3   rosuvastatin (CRESTOR) 40 MG tablet, Take 40 mg by mouth daily., Disp: , Rfl:    spironolactone (ALDACTONE) 25 MG tablet, Take 1 tablet (25 mg total) by mouth daily. (Patient taking differently: Take 25 mg by mouth 2 (two) times daily.), Disp: 90 tablet, Rfl: 1  Orders Placed This Encounter  Procedures   PCV AORTA DUPLEX    --Continue cardiac medications as reconciled in final medication list. --Return in about 1 year (around 07/29/2022) for Follow up HTN and s/p PFO closure, and AAA. Or sooner if needed. --Continue follow-up with your primary care physician regarding the management of your other chronic comorbid conditions.  Patient's questions and concerns were addressed to his satisfaction. He voices understanding of the instructions provided during this encounter.   This note was created using a voice recognition software as a result there may be grammatical errors inadvertently enclosed that do not reflect the nature of this encounter. Every attempt is made to correct such errors.  Rex Kras, [place], [facility]  Pager: [phone] Office: [phone]

## 2021-09-01 ENCOUNTER — Other Ambulatory Visit: Payer: Self-pay | Admitting: Cardiology

## 2022-04-07 ENCOUNTER — Other Ambulatory Visit: Payer: Self-pay

## 2022-04-07 MED ORDER — AMLODIPINE BESYLATE 10 MG PO TABS: ORAL_TABLET | ORAL | 1 refills | Status: DC

## 2022-07-24 ENCOUNTER — Other Ambulatory Visit: Payer: 59

## 2022-07-31 ENCOUNTER — Ambulatory Visit: Payer: Self-pay | Admitting: Cardiology

## 2022-08-02 ENCOUNTER — Ambulatory Visit: Payer: 59 | Admitting: Cardiology

## 2022-08-22 ENCOUNTER — Other Ambulatory Visit: Payer: 59

## 2022-08-27 NOTE — Progress Notes (Signed)
Appt had to be rescheduled.   Tessa Lerner, [place], [facility]  Pager:  [phone] Office: [phone]

## 2022-08-31 ENCOUNTER — Ambulatory Visit: Payer: 59 | Admitting: Cardiology

## 2022-12-06 ENCOUNTER — Other Ambulatory Visit: Payer: Self-pay | Admitting: Cardiology

## 2023-05-28 ENCOUNTER — Ambulatory Visit
Admission: RE | Admit: 2023-05-28 | Discharge: 2023-05-28 | Disposition: A | Discharge status: Home / Self Care | Source: Ambulatory Visit | Attending: Family Medicine | Admitting: Family Medicine

## 2023-05-28 ENCOUNTER — Other Ambulatory Visit: Payer: Self-pay | Admitting: Family Medicine

## 2023-05-28 DIAGNOSIS — M5441 Lumbago with sciatica, right side: Secondary | ICD-10-CM

## 2023-06-09 ENCOUNTER — Other Ambulatory Visit: Payer: Self-pay | Admitting: Cardiology

## 2023-09-05 ENCOUNTER — Other Ambulatory Visit: Payer: Self-pay | Admitting: Urology

## 2023-09-05 DIAGNOSIS — R972 Elevated prostate specific antigen [PSA]: Secondary | ICD-10-CM

## 2023-09-13 ENCOUNTER — Telehealth: Payer: Self-pay

## 2023-09-13 NOTE — Telephone Encounter (Signed)
   Name: Anthony Mcdaniel  DOB: 05-Jun-1970  MRN: [id number]  Primary Cardiologist: None  Chart reviewed as part of pre-operative protocol coverage. Because of [employer] past medical history and time since last visit, he will require a follow-up in-office visit in order to better assess preoperative cardiovascular risk.  Pre-op covering staff: - Please schedule appointment and call patient to inform them. If patient already had an upcoming appointment within acceptable timeframe, please add pre-op clearance to the appointment notes so provider is aware. - Please contact requesting surgeon's office via preferred method (i.e, phone, fax) to inform them of need for appointment prior to surgery.   Wyn Raddle, Jackee Shove, NP  09/13/2023, 12:51 PM

## 2023-09-13 NOTE — Telephone Encounter (Signed)
   Pre-operative Risk Assessment    Patient Name: Anthony Mcdaniel  DOB: 26-Jun-1970 MRN: [id number]   Date of last office visit: 07/31/22 [name], DO Date of next office visit: NONE   Request for Surgical Clearance    Procedure:  PROSTATE BIOPSY  Date of Surgery:  Clearance TBD                                Surgeon:  DR DEVERE Surgeon's Group or Practice Name:  [facility] Phone number:  [phone] Fax number:  [phone]   Type of Clearance Requested:   - Medical  - Pharmacy:  Hold Aspirin  and Clopidogrel  (Plavix ) 5 DAYS PRIOR   Type of Anesthesia:  Not Indicated   Additional requests/questions:    Signed, Lucie DELENA Ku   09/13/2023, 10:42 AM

## 2023-09-14 NOTE — Telephone Encounter (Signed)
Left message for the pt to call back and schedule in office appt for pre op clearance.

## 2023-09-14 NOTE — Telephone Encounter (Signed)
 PRIMARY CARD IS DR. MICHELE.

## 2023-09-19 ENCOUNTER — Encounter: Payer: Self-pay | Admitting: Urology

## 2023-09-19 NOTE — Telephone Encounter (Signed)
 Will update all parties pt has appt 09/27/23 with DR. Tolia.

## 2023-09-27 ENCOUNTER — Encounter: Payer: Self-pay | Admitting: Cardiology

## 2023-09-27 ENCOUNTER — Ambulatory Visit: Attending: Cardiology | Admitting: Cardiology

## 2023-09-27 VITALS — BP 137/85 | HR 77 | Resp 16 | Ht 71.0 in | Wt 301.6 lb

## 2023-09-27 DIAGNOSIS — E782 Mixed hyperlipidemia: Secondary | ICD-10-CM

## 2023-09-27 DIAGNOSIS — Z01818 Encounter for other preprocedural examination: Secondary | ICD-10-CM | POA: Diagnosis not present

## 2023-09-27 DIAGNOSIS — Z8673 Personal history of transient ischemic attack (TIA), and cerebral infarction without residual deficits: Secondary | ICD-10-CM

## 2023-09-27 DIAGNOSIS — I714 Abdominal aortic aneurysm, without rupture, unspecified: Secondary | ICD-10-CM

## 2023-09-27 DIAGNOSIS — Z8774 Personal history of (corrected) congenital malformations of heart and circulatory system: Secondary | ICD-10-CM | POA: Diagnosis not present

## 2023-09-27 NOTE — Progress Notes (Signed)
 Cardiology Office Note:  .   Date:  09/27/2023  ID:  Anthony Mcdaniel, DOB April 19, 1970, MRN [id number] PCP:  Verena Mems, MD  Former Cardiology Providers: Emmalene Lawrence, APRN, FNP-C  [facility] Providers Cardiologist:  None , [facility] (established care 09/15/2019) Electrophysiologist:  None  Click to update primary MD,subspecialty MD or APP then REFRESH:1}    Chief Complaint  Patient presents with   Pre-op Exam   Follow-up    History of Present Illness: .   Anthony Mcdaniel is a 53 y.o. African-American male whose past medical history and cardiovascular risk factors includes: Resistant hypertension, history of NSTEMI, Hx of stroke, AAA, hyperlipidemia, history of stroke, status post PFO closure, obesity due to excess calories.   Patient is followed by the practice given his history of resistant hypertension and history of PFO closure.  He was last seen in the office in July 2023 and present today for follow-up.  Patient is a By his wife at today's office visit who provides collateral history.  Unfortunately, he has been having elevated PSA levels and was referred to Dr. Carolynn from urology and they plan to undergo MRI and prostate biopsy and is referred to cardiology for further evaluation and management.  Clinically he denies anginal chest pain or heart failure symptoms. Patient does have a history of stroke. His resistant hypertension is well-controlled with home SBP around 135 mmHg on current medical therapy. The patient walks 2-3 times per week 30 minutes each on a walking pad and also does free weights.  Review of Systems: .   Review of Systems  Cardiovascular:  Negative for chest pain, claudication, irregular heartbeat, leg swelling, near-syncope, orthopnea, palpitations, paroxysmal nocturnal dyspnea and syncope.  Respiratory:  Negative for shortness of breath.   Hematologic/Lymphatic: Negative for bleeding problem.    Studies Reviewed:   EKG: EKG  Interpretation Date/Time:  Thursday September 27 2023 13:53:33 EDT Ventricular Rate:  74 PR Interval:  222 QRS Duration:  90 QT Interval:  366 QTC Calculation: 406 R Axis:   8  Text Interpretation: Sinus rhythm with 1st degree A-V block Possible Inferior infarct , age undetermined When compared with ECG of 12-Jun-2017 08:10, Borderline criteria for Inferior infarct are now Present Nonspecific T wave changes more prominent in inferolateral leads. Confirmed by Michele Richardson [phone]) on 09/27/2023 2:12:50 PM  Echocardiogram: 06/29/2021: Normal LV systolic function with visual EF 55-60%. Left ventricle cavity is normal in size. Moderate to severe left ventricular hypertrophy. Normal global wall motion. Normal diastolic filling pattern, normal LAP.  Left atrial cavity is mildly dilated. Interatrial septal occluder in place. Negative bubble study.  IVC is dilated with a respiratory response of >50%. Compared to 08/28/2017 severe LAE is now mild otherwise no significant change.    Stress Testing:  Exercise myoview stress 04/06/2017:  Low risk study.  Abdominal Aortic Duplex 06/29/2021: Moderate dilatation of the abdominal aorta is noted in the mid aorta.  An abdominal aortic aneurysm measuring 3.1 x 3.2 x 3.2 cm is seen. Mild diffuse abdominal aortic atherosclerosis especially in the mid abdominal aorta.  Recheck in 3 years for stability.   RADIOLOGY: CTA ABD and pelvis 04/26/2016:  IMPRESSION:  1. Mild right renal artery ostial plaque without convincing high-grade stenosis. 2. Minimal aortic atherosclerosis without aneurysm or stenosis.  Risk Assessment/Calculations:   NA   Labs:    External Labs: Collected: 05/28/2023 [facility] database. Total cholesterol 172, triglycerides 178, HDL 37, LDL 105. Hemoglobin 15.3. Potassium 4.4 TSH 2.74  Physical Exam:  Today's Vitals   09/27/23 1351  BP: 137/85  Pulse: 77  Resp: 16  SpO2: 93%  Weight: (!) 301 lb 9.6 oz (136.8 kg)  Height:  5' 11 (1.803 m)   Body mass index is 42.06 kg/m. Wt Readings from Last 3 Encounters:  09/27/23 (!) 301 lb 9.6 oz (136.8 kg)  07/28/21 289 lb 6.4 oz (131.3 kg)  06/02/21 298 lb (135.2 kg)    Physical Exam  Constitutional: No distress.  hemodynamically stable  Neck: No JVD present.  Cardiovascular: Normal rate, regular rhythm, S1 normal and S2 normal. Exam reveals no gallop, no S3 and no S4.  No murmur heard. Pulmonary/Chest: Effort normal and breath sounds normal. No stridor. He has no wheezes. He has no rales.  Musculoskeletal:        General: No edema.     Cervical back: Neck supple.  Skin: Skin is warm.     Impression & Recommendation(s):  Impression:   ICD-10-CM   1. Preoperative clearance  Z01.818 EKG 12-Lead    2. Abdominal aortic aneurysm (AAA) without rupture, unspecified part ([facility])  I71.40 VAS US  AAA DUPLEX    3. S/P percutaneous patent foramen ovale closure  Z87.74 ECHOCARDIOGRAM LIMITED BUBBLE STUDY    4. Hx of stroke  Z86.73     5. Hyperlipidemia, mixed  E78.2        Recommendation(s):  Preoperative clearance Prostate biopsy, date to be determined, provider Dr. Carolynn EKG shows sinus rhythm with nonspecific inferior lateral T wave changes. According to the Revised Cardiac Risk Index (RCRI), his Perioperative Risk of Major Cardiac Event is (%): 0.9 His Functional Capacity in METs is: 7.25 according to the Duke Activity Status Index (DASI). Plan echocardiogram prior to his prostate biopsy to ensure LVEF is within normal limits, and no regional wall motion abnormalities, with bubble study given his prior PFO closure Patient is cleared to hold aspirin  and Plavix  7 days prior to the procedure and restart once cleared by urology postprocedure.  Abdominal aortic aneurysm (AAA) without rupture, unspecified part ([facility]) Last abdominal aortic duplex June 2023: Dimensions reported to be 3.1 x 3.2 x 3.2 Will arrange a 3-year follow-up study in August 2026 prior to the  next office visit. Patient is a non-smoker. Continue lipid-lowering agents.   Reemphasized importance of better LDL and triglyceride management  S/P percutaneous patent foramen ovale closure Follow-up echocardiogram with bubble study  Hyperlipidemia, mixed Currently on Crestor 40 mg p.o. daily.   He denies myalgia or other side effects. Most recent lipids dated 05/2023, independently reviewed as noted above.  LDL is 105 mg/dL, triglycerides 821 mg/dL.  Cardiology is following peripherally.  Orders Placed:  Orders Placed This Encounter  Procedures   EKG 12-Lead   ECHOCARDIOGRAM LIMITED BUBBLE STUDY    Standing Status:   Future    Expiration Date:   09/26/2024    Where [facility] this test [facility] performed[facility]    Does the patient weigh less than or greater than 250 lbs?:   Patient weighs greater than 250 lbs    Perflutren DEFINITY (image enhancing agent) should be administered unless hypersensitivity or allergy exist:   Administer Perflutren    Reason for exam:   PFO (patent formamen ovale) 745.5 / Q21.1     Final Medication List:   No orders of the defined types were placed in this encounter.   There are no discontinued medications.   Current Outpatient Medications:    amLODipine  (NORVASC ) 10 MG  tablet, TAKE 1 TABLET(10 MG) BY MOUTH DAILY, Disp: 90 tablet, Rfl: 0   aspirin  EC 81 MG tablet, Take 81 mg by mouth daily., Disp: , Rfl:    carvedilol  (COREG ) 25 MG tablet, Take 25 mg by mouth 2 (two) times daily., Disp: , Rfl:    clopidogrel  (PLAVIX ) 75 MG tablet, TAKE 1 TABLET(75 MG) BY MOUTH DAILY, Disp: 90 tablet, Rfl: 1   hydrALAZINE  (APRESOLINE ) 50 MG tablet, TAKE 1 TABLET BY MOUTH THREE TIMES DAILY (Patient taking differently: Take 50 mg by mouth 3 (three) times daily.), Disp: 90 tablet, Rfl: 3   rosuvastatin (CRESTOR) 40 MG tablet, Take 40 mg by mouth daily., Disp: , Rfl:    spironolactone  (ALDACTONE ) 25 MG tablet, Take 1 tablet (25 mg total) by mouth daily. (Patient  taking differently: Take 25 mg by mouth 2 (two) times daily.), Disp: 90 tablet, Rfl: 1  Consent:   NA  Disposition:   1 year follow-up, September 2026  His questions and concerns were addressed to his satisfaction. He voices understanding of the recommendations provided during this encounter.    Signed, Madonna Michele HAS, [facility]  A Division of [facility] [address] [place], [place] [address]  09/27/2023 7:10 PM

## 2023-09-27 NOTE — Patient Instructions (Addendum)
 Medication Instructions:  Your physician recommends that you continue on your current medications as directed. Please refer to the Current Medication list given to you today.  *If you need a refill on your cardiac medications before your next appointment, please call your pharmacy*  Lab Work: None ordered  If you have any lab test that is abnormal or we need to change your treatment, we will call you to review the results.  Testing/Procedures: Your physician has requested that you have a bubble echocardiogram. Echocardiography is a painless test that uses sound waves to create images of your heart. It provides your doctor with information about the size and shape of your heart and how well your heart's chambers and valves are working. This procedure takes approximately one hour. There are no restrictions for this procedure. Please do NOT wear cologne, perfume, aftershave, or lotions (deodorant is allowed). Please arrive 15 minutes prior to your appointment time.  Please note: We ask at that you not bring children with you during ultrasound (echo/ vascular) testing. Due to room size and safety concerns, children are not allowed in the ultrasound rooms during exams. Our front office staff cannot provide observation of children in our lobby area while testing is being conducted. An adult accompanying a patient to their appointment will only be allowed in the ultrasound room at the discretion of the ultrasound technician under special circumstances. We apologize for any inconvenience.   Follow-Up: At [facility], you and your health needs are our priority.  As part of our continuing mission to provide you with exceptional heart care, our providers are all part of one team.  This team includes your primary Cardiologist (physician) and Advanced Practice Providers or APPs (Physician Assistants and Nurse Practitioners) who all work together to provide you with the care you need, when you need  it.  Your next appointment:   1 year(s) (in September 2026)   Provider:   Dr. Michele   Thank you for choosing [facility]!!   [phone]   Other Instructions

## 2023-10-02 ENCOUNTER — Ambulatory Visit (HOSPITAL_COMMUNITY)
Admission: RE | Admit: 2023-10-02 | Discharge: 2023-10-02 | Disposition: A | Discharge status: Home / Self Care | Source: Ambulatory Visit | Attending: Cardiovascular Disease | Admitting: Cardiovascular Disease

## 2023-10-02 DIAGNOSIS — Z8774 Personal history of (corrected) congenital malformations of heart and circulatory system: Secondary | ICD-10-CM | POA: Diagnosis present

## 2023-10-02 LAB — ECHOCARDIOGRAM LIMITED BUBBLE STUDY
Area-P 1/2: 3.79 cm2
Est EF: 55
S' Lateral: 3.4 cm

## 2023-10-05 ENCOUNTER — Ambulatory Visit: Payer: Self-pay | Admitting: Cardiology

## 2023-10-05 ENCOUNTER — Telehealth: Payer: Self-pay

## 2023-10-05 ENCOUNTER — Encounter: Payer: Self-pay | Admitting: Cardiology

## 2023-10-05 NOTE — Telephone Encounter (Signed)
-----   Message from [facility] sent at 10/05/2023 12:13 PM EDT ----- Regarding: Pre-op I have placed a pre-op letter in the the chart.   Please call the patient and inform them of their risk assessment and medication recommendations.   Also update the requesting provider's team of the letter, Dr. Devere.   Let me know if questions arise.   Dr.Tolia

## 2023-10-05 NOTE — Telephone Encounter (Signed)
 Left message for patient to call back.  Letter has been faxed to Dr. Devere

## 2023-10-05 NOTE — Progress Notes (Signed)
 Patient viewed in mychart his results.    Last read by Franky Drones at 2:20PM on 10/05/2023.

## 2023-10-13 ENCOUNTER — Ambulatory Visit
Admission: RE | Admit: 2023-10-13 | Discharge: 2023-10-13 | Disposition: A | Discharge status: Home / Self Care | Source: Ambulatory Visit | Attending: Urology | Admitting: Urology

## 2023-10-13 DIAGNOSIS — R972 Elevated prostate specific antigen [PSA]: Secondary | ICD-10-CM

## 2023-10-13 MED ORDER — GADOPICLENOL 0.5 MMOL/ML IV SOLN
10.0000 mL | Freq: Once | INTRAVENOUS | Status: AC | PRN
  Administered 2023-10-13: 10 mL via INTRAVENOUS

## 2023-10-19 ENCOUNTER — Other Ambulatory Visit: Payer: Self-pay | Admitting: Cardiology

## 2023-12-26 ENCOUNTER — Other Ambulatory Visit (HOSPITAL_COMMUNITY): Payer: Self-pay | Admitting: Urology

## 2023-12-26 DIAGNOSIS — C61 Malignant neoplasm of prostate: Secondary | ICD-10-CM

## 2023-12-28 ENCOUNTER — Encounter: Payer: Self-pay | Admitting: Urology

## 2023-12-31 ENCOUNTER — Telehealth: Payer: Self-pay | Admitting: Radiation Oncology

## 2023-12-31 NOTE — Telephone Encounter (Signed)
 Left message for patient to call back to schedule consult per 12/11 referral.

## 2024-01-01 ENCOUNTER — Telehealth: Payer: Self-pay | Admitting: Radiation Oncology

## 2024-01-01 NOTE — Telephone Encounter (Signed)
 Left message for patient to call back to schedule consult per 12/11 referral.

## 2024-01-15 ENCOUNTER — Encounter (HOSPITAL_COMMUNITY)
Admission: RE | Admit: 2024-01-15 | Discharge: 2024-01-15 | Disposition: A | Discharge status: Home / Self Care | Source: Ambulatory Visit | Attending: Urology | Admitting: Urology

## 2024-01-15 DIAGNOSIS — C61 Malignant neoplasm of prostate: Secondary | ICD-10-CM | POA: Insufficient documentation

## 2024-01-15 MED ORDER — FLOTUFOLASTAT F 18 GALLIUM 296-5846 MBQ/ML IV SOLN
8.0000 | Freq: Once | INTRAVENOUS | Status: AC
  Administered 2024-01-15: 8 via INTRAVENOUS

## 2024-01-21 NOTE — Progress Notes (Signed)
 GU Location of Tumor / Histology: Prostate Ca  If Prostate Cancer, Gleason Score is (4 + 3) and PSA is (5.2 on 07/04/2023)  Franky Drones presented as referral from Dr. Lonni LABOR. [facility]) elevated PSA.  Biopsies      01/15/2024 Dr. Lonni Devere NM PET (PSMA) Skull to Mid Thigh CLINICAL DATA:  Prostate cancer. BX 12/2023.   IMPRESSION: 1. Findings are suggestive of multifocal primary prostate carcinoma. 2. Intense focal PSMA activity just right of midline at the level of the pelvic floor is favored to represent urinary activity within the membranous urethra, though an apical prostatic lesion is not entirely excluded. 3. No evidence of metastatic adenopathy, visceral metastasis, or skeletal metastasis.   10/13/2023 Dr. Lonni A. Winter MR Prostate with/wProstate cancer. BX 12/2023. ithout Contrast CLINICAL DATA:  Elevated PSA.   IMPRESSION: 1. PI-RADS category 3 lesion in the left posterolateral peripheral zone, mid gland and apex, measuring 0.45 cubic cm. Targeting data sent to the [facility] system.   Past/Anticipated interventions by urology, if any:  Dr. Lonni LABOR. [place]   Past/Anticipated interventions by medical oncology, if any:  NA  Weight changes, if any:  No  IPSS:  3 SHIM:  24  Bowel/Bladder complaints, if any:  No  Nausea/Vomiting, if any: No  Pain issues, if any:  0/10  SAFETY ISSUES: Prior radiation?  No Pacemaker/ICD? No Possible current pregnancy? Male Is the patient on methotrexate? No  Current Complaints / other details: None  30 minutes spent total, including time for meaningful use questions, reviewing medication, as well as spent in face-to-face time in nurse evaluation with the patient.

## 2024-01-21 NOTE — Progress Notes (Signed)
 STAT read requested on recent PSMA PET for upcoming consult on 01/22/24.

## 2024-01-21 NOTE — Progress Notes (Signed)
 " Radiation Oncology         [phone] ________________________________  Initial Outpatient Consultation  Name: Anthony Mcdaniel MRN: [id number]  Date: 01/22/2024  DOB: 1970/09/08  RR:[id number], Alejandra, DO  Devere Lonni Righter, MD   REFERRING PHYSICIAN: Devere Lonni Righter, MD  DIAGNOSIS: 54 y.o. gentleman with Stage T1c adenocarcinoma of the prostate with Gleason Score of 4+3+7 and 3+3=6, and PSA of 5.2.    ICD-10-CM   1. Malignant neoplasm of prostate (HCC)  C61       HISTORY OF PRESENT ILLNESS: Anthony Mcdaniel is a 54 y.o. male with a diagnosis of prostate cancer. He was noted to have an elevated PSA of 5.2 by Dr. Reena Duck.  Accordingly, he was referred for evaluation in urology by Dr. Devere on 08/30/2023.  A prostate MRI was performed on 10/13/2023 showing a PI-RADS 3 lesion in the left posterior lateral peripheral zone in the mid and apex.  The patient proceeded to MRI fusion transrectal ultrasound with 13 biopsies of the prostate on 12/17/2023.  The prostate volume measured 30 cc.  Out of 13 core biopsies, 5 were positive.  The maximum Gleason score was 4+3=7, and this was seen in left mid lateral and MRI ROI.  Additionally, Gleason 3+3 was seen in the left apex, left apex lateral and right apex lateral.  A PSMA PET was performed 01/15/24 to complete disease staging and showed activity in bilateral prostate lobes, suggestive of multifocal prostate primary.  There was intense focal PSMA activity just right of the midline at the level of the pelvic floor that is favored to represent urinary activity within the membranous urethra, though an apical prostatic lesion is not entirely excluded. No evidence of metastatic adenopathy, visceral metastasis, or skeletal metastasis.    The patient reviewed the biopsy and imaging results with his urologist and he has kindly been referred today for discussion of potential radiation treatment options.  He has also met with Dr. Renda on  01/01/2024 to discuss surgical treatment options.   PREVIOUS RADIATION THERAPY: No  PAST MEDICAL HISTORY:  Past Medical History:  Diagnosis Date   Elevated PSA    Hypercholesteremia    Hypertension    NSTEMI (non-ST elevated myocardial infarction) (HCC)    PFO (patent foramen ovale)    Prostate cancer (HCC)    Stroke (HCC)       PAST SURGICAL HISTORY: Past Surgical History:  Procedure Laterality Date   PATENT FORAMEN OVALE(PFO) CLOSURE N/A 06/12/2017   Procedure: PATENT FORAMEN OVALE (PFO) CLOSURE;  Surgeon: Ladona Heinz, MD;  Location: [facility] INVASIVE CV LAB;  Service: Cardiovascular;  Laterality: N/A;   PROSTATE BIOPSY     TEE WITHOUT CARDIOVERSION N/A 03/19/2017   Procedure: TRANSESOPHAGEAL ECHOCARDIOGRAM (TEE);  Surgeon: Elmira Newman PARAS, MD;  Location: [facility] ENDOSCOPY;  Service: Cardiovascular;  Laterality: N/A;    FAMILY HISTORY:  Family History  Problem Relation Age of Onset   Stroke Mother    Kidney failure Father     SOCIAL HISTORY:  Social History   Socioeconomic History   Marital status: Married    Spouse name: Not on file   Number of children: 2   Years of education: Not on file   Highest education level: Not on file  Occupational History   Not on file  Tobacco Use   Smoking status: Never   Smokeless tobacco: Never  Vaping Use   Vaping status: Never Used  Substance and Sexual Activity   Alcohol use: No   Drug use:  No   Sexual activity: Yes  Other Topics Concern   Not on file  Social History Narrative   Not on file   Social Drivers of Health   Tobacco Use: Low Risk (01/22/2024)   Patient History    Smoking Tobacco Use: Never    Smokeless Tobacco Use: Never    Passive Exposure: Not on file  Financial Resource Strain: Not on file  Food Insecurity: No Food Insecurity (01/22/2024)   Epic    Worried About Programme Researcher, Broadcasting/film/video in the Last Year: Never true    Ran Out of Food in the Last Year: Never true  Transportation Needs: No Transportation Needs  (01/22/2024)   Epic    Lack of Transportation (Medical): No    Lack of Transportation (Non-Medical): No  Physical Activity: Not on file  Stress: Not on file  Social Connections: Not on file  Intimate Partner Violence: Not At Risk (01/22/2024)   Epic    Fear of Current or Ex-Partner: No    Emotionally Abused: No    Physically Abused: No    Sexually Abused: No  Depression (PHQ2-9): Low Risk (01/22/2024)   Depression (PHQ2-9)    PHQ-2 Score: 1  Alcohol Screen: Low Risk (01/22/2024)   Alcohol Screen    Last Alcohol Screening Score (AUDIT): 0  Housing: Low Risk (01/22/2024)   Epic    Unable to Pay for Housing in the Last Year: No    Number of Times Moved in the Last Year: 0    Homeless in the Last Year: No  Utilities: Not At Risk (01/22/2024)   Epic    Threatened with loss of utilities: No  Health Literacy: Not on file    ALLERGIES: Hydrochlorothiazide  MEDICATIONS:  Current Outpatient Medications  Medication Sig Dispense Refill   amLODipine  (NORVASC ) 10 MG tablet TAKE 1 TABLET(10 MG) BY MOUTH DAILY 90 tablet 3   aspirin  EC 81 MG tablet Take 81 mg by mouth daily.     carvedilol  (COREG ) 25 MG tablet Take 25 mg by mouth 2 (two) times daily.     hydrALAZINE  (APRESOLINE ) 50 MG tablet TAKE 1 TABLET BY MOUTH THREE TIMES DAILY (Patient taking differently: Take 50 mg by mouth 3 (three) times daily.) 90 tablet 3   rosuvastatin (CRESTOR) 40 MG tablet Take 40 mg by mouth daily.     spironolactone  (ALDACTONE ) 25 MG tablet Take 1 tablet (25 mg total) by mouth daily. (Patient taking differently: Take 25 mg by mouth 2 (two) times daily.) 90 tablet 1   No current facility-administered medications for this encounter.    REVIEW OF SYSTEMS:  On review of systems, the patient reports that he is doing well overall. He denies any chest pain, shortness of breath, cough, fevers, chills, night sweats, unintended weight changes. He denies any bowel disturbances, and denies abdominal pain, nausea or vomiting. He  denies any new musculoskeletal or joint aches or pains. His IPSS was 3, indicating mild urinary symptoms. His SHIM was 24, indicating he does not have erectile dysfunction. A complete review of systems is obtained and is otherwise negative.    PHYSICAL EXAM:  Wt Readings from Last 3 Encounters:  01/22/24 (!) 303 lb 9.6 oz (137.7 kg)  01/15/24 285 lb (129.3 kg)  09/27/23 (!) 301 lb 9.6 oz (136.8 kg)   Temp Readings from Last 3 Encounters:  01/22/24 (!) 97.4 F (36.3 C)  07/28/21 97.9 F (36.6 C) (Temporal)  06/02/21 97.9 F (36.6 C) (Temporal)   BP Readings  from Last 3 Encounters:  01/22/24 (!) 157/104  09/27/23 137/85  07/28/21 132/83   Pulse Readings from Last 3 Encounters:  01/22/24 72  09/27/23 77  07/28/21 65   Pain Assessment Pain Score: 0-No pain/10  In general this is a well appearing African-American male in no acute distress. He's alert and oriented x4 and appropriate throughout the examination. Cardiopulmonary assessment is negative for acute distress, and he exhibits normal effort.    KPS = 100  100 - Normal; no complaints; no evidence of disease. 90   - Able to carry on normal activity; minor signs or symptoms of disease. 80   - Normal activity with effort; some signs or symptoms of disease. 75   - Cares for self; unable to carry on normal activity or to do active work. 60   - Requires occasional assistance, but is able to care for most of his personal needs. 50   - Requires considerable assistance and frequent medical care. 40   - Disabled; requires special care and assistance. 30   - Severely disabled; hospital admission is indicated although death not imminent. 20   - Very sick; hospital admission necessary; active supportive treatment necessary. 10   - Moribund; fatal processes progressing rapidly. 0     - Dead  Karnofsky DA, Abelmann WH, Craver LS and [facility] [phone]) The use of the nitrogen mustards in the palliative treatment of carcinoma: with  particular reference to bronchogenic carcinoma Cancer 1 634-56  LABORATORY DATA:  Lab Results  Component Value Date   WBC 7.0 03/19/2017   HGB 10.5 (L) 03/19/2017   HCT 33.4 (L) 03/19/2017   MCV 83.5 03/19/2017   PLT 390 03/19/2017   Lab Results  Component Value Date   NA 141 03/04/2018   K 4.3 03/04/2018   CL 103 03/04/2018   CO2 22 03/04/2018   Lab Results  Component Value Date   ALT 83 (H) 03/16/2017   AST 47 (H) 03/16/2017   ALKPHOS 69 03/16/2017   BILITOT 1.0 03/16/2017     RADIOGRAPHY: NM PET (PSMA) SKULL TO MID THIGH Result Date: 01/21/2024 EXAM: PROSTATE PET SKULL BASE TO MID THIGHS 01/15/2024 05:12:35 PM TECHNIQUE: RADIOPHARMACEUTICAL: 8 mCi F-18 flotufolastaf (Posluma ) injected intravenously. PET imaging was obtained from skull vertex to mid thighs. Computed tomography was used for attenuation correction and localization. Fusion imaging was obtained. COMPARISON: None available. CLINICAL HISTORY: Prostate cancer. BX 12/2023. FINDINGS: PROSTATE AND PROSTATE BED: There are several regions of mild focal PSMA activity within the left and right lobes of the prostate gland. For example, at the left apex, a small lesion with SUVmax equal 5.5. There is a focus of intense PSMA activity more inferiorly just right of midline at the level of the pelvic floor with SUVmax 15.5 on image 207. Difficult to tell if this activity is in the apex of the prostate gland or potentially within the membranous urethra of the penis. The activity is very intense which would favor urine activity (image 206). LYMPH NODES: No PSMA avid lymph nodes. No PSMA avid paraaortic lymph nodes. BONES: No abnormal PSMA activity localizes to the bones. No skeletal metastasis. OTHER PET FINDINGS: Physiologic activity within the salivary glands, liver, spleen, kidneys, bowel, and urinary bladder. No metastatic disease in the thorax. No evidence of visceral metastasis. IMPRESSION: 1. Findings are suggestive of multifocal  primary prostate carcinoma. 2. Intense focal PSMA activity just right of midline at the level of the pelvic floor is favored to represent urinary activity  within the membranous urethra, though an apical prostatic lesion is not entirely excluded. 3. No evidence of metastatic adenopathy, visceral metastasis, or skeletal metastasis. Electronically signed by: Norleen Boxer MD 01/21/2024 04:37 PM EST RP Workstation: [id number]      IMPRESSION/PLAN: 1. 54 y.o. gentleman with Stage T1c adenocarcinoma of the prostate with Gleason Score of 4+3, and PSA of 5.2. We discussed the patient's workup and outlined the nature of prostate cancer in this setting. The patient's T stage, Gleason's score, and PSA put him into the unfavorable intermediate risk group. Accordingly, he is eligible for a variety of potential treatment options including brachytherapy, 5.5 weeks of external radiation or prostatectomy. We discussed the available radiation techniques, and focused on the details and logistics of delivery.  We discussed and outlined the risks, benefits, short and long-term effects associated with radiotherapy and compared and contrasted these with prostatectomy. We discussed the role of SpaceOAR gel in reducing the rectal toxicity associated with radiotherapy.  He appears to have a good understanding of his disease and our treatment recommendations which are of curative intent.  He was encouraged to ask questions that were answered to his stated satisfaction.  At the conclusion of our conversation, the patient is undecided regarding his treatment preference between prostatectomy versus brachytherapy.  He would like to take some additional time to consider his options so we will share our discussion with Dr. Devere and he has our contact information to let us  know once he reaches a decision.  If he ultimately elects to proceed with brachytherapy, we will proceed with treatment planning accordingly at that time.  We enjoyed  meeting him today and look forward to continuing to participate in his care.  We personally spent 70 minutes in this encounter including chart review, reviewing radiological studies, meeting face-to-face with the patient, entering orders and completing documentation.    Sabra MICAEL Rusk, PA-C    Donnice Barge, MD  [facility]  Radiation Oncology Direct Dial: [phone]  Fax: [phone] [email or web address]  Skype  LinkedIn   "

## 2024-01-22 ENCOUNTER — Ambulatory Visit
Admission: RE | Admit: 2024-01-22 | Discharge: 2024-01-22 | Disposition: A | Discharge status: Home / Self Care | Source: Ambulatory Visit | Attending: Radiation Oncology | Admitting: Radiation Oncology

## 2024-01-22 ENCOUNTER — Encounter: Payer: Self-pay | Admitting: Radiation Oncology

## 2024-01-22 VITALS — BP 157/104 | HR 72 | Temp 97.4°F | Resp 20 | Ht 71.0 in | Wt 303.6 lb

## 2024-01-22 DIAGNOSIS — I10 Essential (primary) hypertension: Secondary | ICD-10-CM | POA: Insufficient documentation

## 2024-01-22 DIAGNOSIS — E7800 Pure hypercholesterolemia, unspecified: Secondary | ICD-10-CM | POA: Diagnosis not present

## 2024-01-22 DIAGNOSIS — C61 Malignant neoplasm of prostate: Secondary | ICD-10-CM | POA: Insufficient documentation

## 2024-01-22 DIAGNOSIS — Q2112 Patent foramen ovale: Secondary | ICD-10-CM | POA: Insufficient documentation

## 2024-01-22 DIAGNOSIS — Z8673 Personal history of transient ischemic attack (TIA), and cerebral infarction without residual deficits: Secondary | ICD-10-CM | POA: Diagnosis not present

## 2024-01-22 DIAGNOSIS — I252 Old myocardial infarction: Secondary | ICD-10-CM | POA: Insufficient documentation

## 2024-01-22 DIAGNOSIS — Z79899 Other long term (current) drug therapy: Secondary | ICD-10-CM | POA: Insufficient documentation

## 2024-01-22 DIAGNOSIS — Z7982 Long term (current) use of aspirin: Secondary | ICD-10-CM | POA: Insufficient documentation

## 2024-01-22 HISTORY — DX: Malignant neoplasm of prostate: C61

## 2024-01-22 HISTORY — DX: Elevated prostate specific antigen (PSA): R97.20

## 2024-01-22 NOTE — Progress Notes (Signed)
 Introduced myself to the patient, and his wife, as the prostate nurse navigator. He is here to discuss his radiation treatment options and would also like to proceed with a surgical consult at [facility].  RN will work to coordinate consult for him.  I provided patient with some written education and my direct contact information.  Patient knows to reach out with any questions or barriers that may arise.

## 2024-01-28 NOTE — Progress Notes (Signed)
 Patient met with Dr. Renda on 01/01/24 for surgical consult.   RN left message for call back to review consult on 12/16 and assess for any additional questions.

## 2024-02-01 NOTE — Progress Notes (Signed)
 RN spoke with patient and he does recall have surgical consult with Dr. Renda on 12/16.   Patient remains undecided at this time regarding treatment decision.  No additional questions.  Agreeable for follow up in the next 1-2 weeks.  I encouraged patient to call prior with any questions or treatment decision.

## 2024-02-21 NOTE — Progress Notes (Signed)
 RN left message for call back to follow up with treatment decision or any additional questions.
